# Patient Record
Sex: Female | Born: 1985
Health system: Southern US, Community
[De-identification: ages and names within clinical notes are randomized; demographics above are authoritative.]

## PROBLEM LIST (undated history)

## (undated) DIAGNOSIS — B009 Herpesviral infection, unspecified: Secondary | ICD-10-CM

## (undated) DIAGNOSIS — I10 Essential (primary) hypertension: Secondary | ICD-10-CM

## (undated) DIAGNOSIS — L102 Pemphigus foliaceous: Secondary | ICD-10-CM

## (undated) DIAGNOSIS — J45909 Unspecified asthma, uncomplicated: Secondary | ICD-10-CM

## (undated) HISTORY — PX: HERNIA REPAIR: SHX51

## (undated) HISTORY — PX: TONSILLECTOMY: SUR1361

---
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
## 2009-05-13 ENCOUNTER — Emergency Department (HOSPITAL_COMMUNITY): Admission: EM | Admit: 2009-05-13 | Discharge: 2009-05-13 | Payer: Self-pay | Admitting: Emergency Medicine

## 2010-10-26 ENCOUNTER — Emergency Department (HOSPITAL_COMMUNITY)
Admission: EM | Admit: 2010-10-26 | Discharge: 2010-10-26 | Payer: Self-pay | Source: Home / Self Care | Admitting: Emergency Medicine

## 2011-09-10 DIAGNOSIS — L102 Pemphigus foliaceous: Secondary | ICD-10-CM | POA: Insufficient documentation

## 2011-09-10 DIAGNOSIS — L109 Pemphigus, unspecified: Secondary | ICD-10-CM | POA: Insufficient documentation

## 2014-03-30 ENCOUNTER — Emergency Department (HOSPITAL_COMMUNITY): Payer: No Typology Code available for payment source

## 2014-03-30 ENCOUNTER — Emergency Department (HOSPITAL_COMMUNITY)
Admission: EM | Admit: 2014-03-30 | Discharge: 2014-03-30 | Disposition: A | Discharge status: Home / Self Care | Payer: No Typology Code available for payment source | Attending: Emergency Medicine | Admitting: Emergency Medicine

## 2014-03-30 ENCOUNTER — Encounter (HOSPITAL_COMMUNITY): Payer: Self-pay | Admitting: Emergency Medicine

## 2014-03-30 DIAGNOSIS — Y9389 Activity, other specified: Secondary | ICD-10-CM | POA: Insufficient documentation

## 2014-03-30 DIAGNOSIS — Y92410 Unspecified street and highway as the place of occurrence of the external cause: Secondary | ICD-10-CM | POA: Insufficient documentation

## 2014-03-30 DIAGNOSIS — Z872 Personal history of diseases of the skin and subcutaneous tissue: Secondary | ICD-10-CM | POA: Insufficient documentation

## 2014-03-30 DIAGNOSIS — IMO0002 Reserved for concepts with insufficient information to code with codable children: Secondary | ICD-10-CM | POA: Insufficient documentation

## 2014-03-30 DIAGNOSIS — T2121XA Burn of second degree of chest wall, initial encounter: Secondary | ICD-10-CM | POA: Insufficient documentation

## 2014-03-30 DIAGNOSIS — Z79899 Other long term (current) drug therapy: Secondary | ICD-10-CM | POA: Insufficient documentation

## 2014-03-30 DIAGNOSIS — J45909 Unspecified asthma, uncomplicated: Secondary | ICD-10-CM | POA: Insufficient documentation

## 2014-03-30 HISTORY — DX: Pemphigus foliaceous: L10.2

## 2014-03-30 HISTORY — DX: Unspecified asthma, uncomplicated: J45.909

## 2014-03-30 MED ORDER — OXYCODONE-ACETAMINOPHEN 5-325 MG PO TABS
1.0000 | ORAL_TABLET | Freq: Once | ORAL | Status: AC
  Administered 2014-03-30: 1 via ORAL
  Filled 2014-03-30: qty 1

## 2014-03-30 MED ORDER — IBUPROFEN 200 MG PO TABS
600.0000 mg | ORAL_TABLET | Freq: Once | ORAL | Status: AC
  Administered 2014-03-30: 600 mg via ORAL
  Filled 2014-03-30: qty 3

## 2014-03-30 MED ORDER — HYDROCODONE-ACETAMINOPHEN 5-325 MG PO TABS: 1.0000 | ORAL_TABLET | ORAL | Status: DC | PRN

## 2014-03-30 MED ORDER — IBUPROFEN 600 MG PO TABS: 600.0000 mg | ORAL_TABLET | Freq: Three times a day (TID) | ORAL | Status: DC | PRN

## 2014-03-30 NOTE — ED Notes (Signed)
Assumed care of patient Patient alert and oriented x 4 Patient ambulatory to bathroom without difficulty or nursing need of assistance--gait steady

## 2014-03-30 NOTE — ED Notes (Signed)
Patient transported to X-ray 
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
## 2014-03-30 NOTE — ED Notes (Signed)
MD at bedside. 

## 2014-03-30 NOTE — Discharge Instructions (Signed)
Burn Care Your skin is a natural barrier to infection. It is the largest organ of your body. Burns damage this natural protection. To help prevent infection, it is very important to follow your caregiver's instructions in the care of your burn. Burns are classified as:  First degree. There is only redness of the skin (erythema). No scarring is expected.  Second degree. There is blistering of the skin. Scarring may occur with deeper burns.  Third degree. All layers of the skin are injured, and scarring is expected. HOME CARE INSTRUCTIONS   Wash your hands well before changing your bandage.  Change your bandage as often as directed by your caregiver.  Remove the old bandage. If the bandage sticks, you may soak it off with cool, clean water.  Cleanse the burn thoroughly but gently with mild soap and water.  Pat the area dry with a clean, dry cloth.  Apply a thin layer of antibacterial cream to the burn.  Apply a clean bandage as instructed by your caregiver.  Keep the bandage as clean and dry as possible.  Elevate the affected area for the first 24 hours, then as instructed by your caregiver.  Only take over-the-counter or prescription medicines for pain, discomfort, or fever as directed by your caregiver. SEEK IMMEDIATE MEDICAL CARE IF:   You develop excessive pain.  You develop redness, tenderness, swelling, or red streaks near the burn.  The burned area develops yellowish-white fluid (pus) or a bad smell.  You have a fever. MAKE SURE YOU:   Understand these instructions.  Will watch your condition.  Will get help right away if you are not doing well or get worse. Document Released: 09/20/2005 Document Revised: 12/13/2011 Document Reviewed: 02/10/2011 ExitCare Patient Information 2015 [employer]. This information is not intended to replace advice given to you by your health care provider. Make sure you discuss any questions you have with your health care  provider.  
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
## 2015-07-09 DIAGNOSIS — O163 Unspecified maternal hypertension, third trimester: Secondary | ICD-10-CM | POA: Insufficient documentation

## 2015-07-21 DIAGNOSIS — O09619 Supervision of young primigravida, unspecified trimester: Secondary | ICD-10-CM | POA: Insufficient documentation

## 2015-10-31 DIAGNOSIS — H5213 Myopia, bilateral: Secondary | ICD-10-CM | POA: Diagnosis not present

## 2016-01-22 DIAGNOSIS — Z79899 Other long term (current) drug therapy: Secondary | ICD-10-CM | POA: Diagnosis not present

## 2016-01-22 DIAGNOSIS — Z7952 Long term (current) use of systemic steroids: Secondary | ICD-10-CM | POA: Diagnosis not present

## 2016-01-22 DIAGNOSIS — L700 Acne vulgaris: Secondary | ICD-10-CM | POA: Diagnosis not present

## 2016-01-22 DIAGNOSIS — L102 Pemphigus foliaceous: Secondary | ICD-10-CM | POA: Diagnosis not present

## 2016-02-03 MED FILL — NIFEDIPINE ER 30 MG TABLET: 30 | 30 days supply | Qty: 60 | Fill #0

## 2016-02-03 MED FILL — LABETALOL HCL 200 MG TABLET: 200 | 30 days supply | Qty: 60 | Fill #0

## 2016-02-10 MED FILL — azaTHIOprine 50 MG TABS: 50 | 90 days supply | Qty: 180 | Fill #0

## 2016-03-05 DIAGNOSIS — Z79899 Other long term (current) drug therapy: Secondary | ICD-10-CM | POA: Diagnosis not present

## 2016-03-25 MED FILL — LABETALOL HCL 200 MG TABLET: 200 | 30 days supply | Qty: 60 | Fill #1

## 2016-03-25 MED FILL — NIFEDIPINE ER 30 MG TABLET: 30 | 30 days supply | Qty: 60 | Fill #1

## 2016-04-13 MED FILL — azaTHIOprine 50 MG TABS: 50 | 30 days supply | Qty: 90 | Fill #0

## 2016-04-22 DIAGNOSIS — L102 Pemphigus foliaceous: Secondary | ICD-10-CM | POA: Diagnosis not present

## 2016-05-06 MED FILL — azaTHIOprine 50 MG TABS: 50 | 30 days supply | Qty: 90 | Fill #1

## 2016-05-19 MED FILL — HEATHER TABLET: 0.35 | 28 days supply | Qty: 28 | Fill #0

## 2016-05-27 MED FILL — NIFEDIPINE ER 30 MG TABLET: 30 | 30 days supply | Qty: 60 | Fill #2

## 2016-05-27 MED FILL — azaTHIOprine 50 MG TABS: 50 | 30 days supply | Qty: 90 | Fill #2

## 2016-05-27 MED FILL — LABETALOL HCL 200 MG TABLET: 200 | 30 days supply | Qty: 60 | Fill #2

## 2016-06-14 MED FILL — HEATHER TABLET: 0.35 | 28 days supply | Qty: 28 | Fill #1

## 2016-06-23 MED FILL — azaTHIOprine 50 MG TABS: 50 | 30 days supply | Qty: 90 | Fill #3

## 2016-07-14 MED FILL — azaTHIOprine 50 MG TABS: 50 | 22 days supply | Qty: 90 | Fill #0

## 2016-07-16 MED FILL — HEATHER TABLET: 0.35 | 28 days supply | Qty: 28 | Fill #2

## 2016-07-20 MED FILL — NIFEDIPINE ER 30 MG TABLET: 30 | 30 days supply | Qty: 60 | Fill #3

## 2016-07-20 MED FILL — LABETALOL HCL 200 MG TABLET: 200 | 30 days supply | Qty: 60 | Fill #3

## 2016-07-21 DIAGNOSIS — Z79899 Other long term (current) drug therapy: Secondary | ICD-10-CM | POA: Diagnosis not present

## 2016-07-21 DIAGNOSIS — L102 Pemphigus foliaceous: Secondary | ICD-10-CM | POA: Diagnosis not present

## 2016-07-21 MED FILL — predniSONE 5 MG TABS: 5 | 42 days supply | Qty: 42 | Fill #0

## 2016-07-23 DIAGNOSIS — Z79899 Other long term (current) drug therapy: Secondary | ICD-10-CM | POA: Diagnosis not present

## 2016-08-13 MED FILL — azaTHIOprine 50 MG TABS: 50 | 30 days supply | Qty: 120 | Fill #0

## 2016-08-18 DIAGNOSIS — Z01419 Encounter for gynecological examination (general) (routine) without abnormal findings: Secondary | ICD-10-CM | POA: Diagnosis not present

## 2016-08-18 DIAGNOSIS — N898 Other specified noninflammatory disorders of vagina: Secondary | ICD-10-CM | POA: Diagnosis not present

## 2016-08-18 DIAGNOSIS — Z1151 Encounter for screening for human papillomavirus (HPV): Secondary | ICD-10-CM | POA: Diagnosis not present

## 2016-08-18 DIAGNOSIS — Z6826 Body mass index (BMI) 26.0-26.9, adult: Secondary | ICD-10-CM | POA: Diagnosis not present

## 2016-08-18 MED FILL — metroNIDAZOLE 500 MG TABS: 500 | 7 days supply | Qty: 14 | Fill #0

## 2016-08-18 MED FILL — HYDROCHLOROTHIAZIDE 25 MG T: 25 | 90 days supply | Qty: 90 | Fill #0

## 2016-08-31 MED FILL — FLUCONAZOLE 150 MG TABLET: 150 | 1 days supply | Qty: 1 | Fill #0

## 2016-09-20 MED FILL — azaTHIOprine 50 MG TABS: 50 | 30 days supply | Qty: 120 | Fill #1

## 2016-10-01 MED FILL — predniSONE 5 MG TABS: 5 | 42 days supply | Qty: 40 | Fill #0

## 2016-10-06 MED FILL — metroNIDAZOLE 500 MG TABS: 500 | 7 days supply | Qty: 14 | Fill #0

## 2016-10-06 MED FILL — FLUCONAZOLE 150 MG TABLET: 150 | 1 days supply | Qty: 1 | Fill #0

## 2016-10-18 ENCOUNTER — Other Ambulatory Visit: Payer: Self-pay | Admitting: Obstetrics

## 2016-10-18 DIAGNOSIS — I10 Essential (primary) hypertension: Secondary | ICD-10-CM

## 2016-10-18 MED ORDER — AMLODIPINE BESYLATE 5 MG PO TABS: 5.0000 mg | ORAL_TABLET | Freq: Every day | ORAL | 11 refills | Status: DC

## 2016-10-18 MED ORDER — CARVEDILOL 12.5 MG PO TABS: 12.5000 mg | ORAL_TABLET | Freq: Two times a day (BID) | ORAL | 11 refills | Status: DC

## 2016-10-18 MED FILL — AMLODIPINE BESYLATE 5 MG TA: 5 | 30 days supply | Qty: 30 | Fill #0

## 2016-10-18 MED FILL — CARVEDILOL 12.5 MG TABLET: 12.5 | 30 days supply | Qty: 60 | Fill #0

## 2016-10-19 DIAGNOSIS — L102 Pemphigus foliaceous: Secondary | ICD-10-CM | POA: Diagnosis not present

## 2016-10-19 DIAGNOSIS — Z79899 Other long term (current) drug therapy: Secondary | ICD-10-CM | POA: Diagnosis not present

## 2016-10-19 MED FILL — CLOBETASOL 0.05% SOLUTION: 0.05 | 30 days supply | Qty: 50 | Fill #0

## 2016-10-21 DIAGNOSIS — Z79899 Other long term (current) drug therapy: Secondary | ICD-10-CM | POA: Diagnosis not present

## 2016-10-25 MED FILL — azaTHIOprine 50 MG TABS: 50 | 30 days supply | Qty: 120 | Fill #2

## 2016-11-04 MED FILL — HYDROCHLOROTHIAZIDE 25 MG T: 25 | 90 days supply | Qty: 90 | Fill #1

## 2016-11-15 DIAGNOSIS — L109 Pemphigus, unspecified: Secondary | ICD-10-CM | POA: Diagnosis not present

## 2016-11-15 DIAGNOSIS — Z79899 Other long term (current) drug therapy: Secondary | ICD-10-CM | POA: Diagnosis not present

## 2016-11-16 MED FILL — AMLODIPINE BESYLATE 5 MG TA: 5 | 30 days supply | Qty: 30 | Fill #1

## 2016-11-25 MED FILL — CARVEDILOL 12.5 MG TABLET: 12.5 | 30 days supply | Qty: 60 | Fill #1

## 2016-11-30 DIAGNOSIS — L109 Pemphigus, unspecified: Secondary | ICD-10-CM | POA: Diagnosis not present

## 2016-11-30 DIAGNOSIS — Z79899 Other long term (current) drug therapy: Secondary | ICD-10-CM | POA: Diagnosis not present

## 2016-11-30 MED FILL — azaTHIOprine 50 MG TABS: 50 | 30 days supply | Qty: 120 | Fill #0

## 2016-12-02 MED FILL — TAYTULLA 1 MG-20 MCG CAP: 1-20 | 28 days supply | Qty: 28 | Fill #0

## 2016-12-15 MED FILL — AMLODIPINE BESYLATE 5 MG TA: 5 | 30 days supply | Qty: 30 | Fill #2

## 2016-12-30 MED FILL — azaTHIOprine 50 MG TABS: 50 | 30 days supply | Qty: 120 | Fill #1

## 2016-12-31 MED FILL — LARIN 24 FE 1 MG-20 MCG TAB: 1-20 | 28 days supply | Qty: 28 | Fill #0

## 2017-01-11 MED FILL — CARVEDILOL 12.5 MG TABLET: 12.5 | 30 days supply | Qty: 60 | Fill #2

## 2017-01-18 DIAGNOSIS — L102 Pemphigus foliaceous: Secondary | ICD-10-CM | POA: Diagnosis not present

## 2017-01-18 DIAGNOSIS — M2550 Pain in unspecified joint: Secondary | ICD-10-CM | POA: Diagnosis not present

## 2017-01-18 MED FILL — predniSONE 5 MG TABS: 5 | 30 days supply | Qty: 60 | Fill #0

## 2017-01-21 MED FILL — AMLODIPINE BESYLATE 5 MG TA: 5 | 30 days supply | Qty: 30 | Fill #3

## 2017-01-31 MED FILL — azaTHIOprine 50 MG TABS: 50 | 30 days supply | Qty: 120 | Fill #2

## 2017-01-31 MED FILL — LARIN 24 FE 1 MG-20 MCG TAB: 1-20 | 84 days supply | Qty: 84 | Fill #1

## 2017-02-14 ENCOUNTER — Other Ambulatory Visit: Payer: Self-pay

## 2017-02-14 DIAGNOSIS — N898 Other specified noninflammatory disorders of vagina: Secondary | ICD-10-CM

## 2017-02-14 MED ORDER — FLUCONAZOLE 150 MG PO TABS: 150.0000 mg | ORAL_TABLET | Freq: Once | ORAL | 0 refills | Status: DC

## 2017-02-14 MED FILL — FLUCONAZOLE 150 MG TABLET: 150 | 1 days supply | Qty: 1 | Fill #0

## 2017-02-21 MED FILL — AMLODIPINE BESYLATE 5 MG TA: 5 | 30 days supply | Qty: 30 | Fill #4

## 2017-02-23 MED FILL — HYDROCHLOROTHIAZIDE 25 MG T: 25 | 90 days supply | Qty: 90 | Fill #2

## 2017-03-07 MED FILL — azaTHIOprine 50 MG TABS: 50 | 30 days supply | Qty: 120 | Fill #0

## 2017-03-14 MED FILL — CARVEDILOL 12.5 MG TABLET: 12.5 | 90 days supply | Qty: 180 | Fill #3

## 2017-03-23 ENCOUNTER — Other Ambulatory Visit: Payer: Self-pay

## 2017-03-23 MED ORDER — METRONIDAZOLE 500 MG PO TABS: 500.0000 mg | ORAL_TABLET | Freq: Two times a day (BID) | ORAL | 0 refills | Status: DC

## 2017-03-23 MED ORDER — FLUCONAZOLE 150 MG PO TABS: 150.0000 mg | ORAL_TABLET | Freq: Once | ORAL | 0 refills | Status: DC

## 2017-03-23 MED FILL — metroNIDAZOLE 500 MG TABS: 500 | 7 days supply | Qty: 14 | Fill #0

## 2017-03-23 MED FILL — FLUCONAZOLE 150 MG TABLET: 150 | 1 days supply | Qty: 1 | Fill #0

## 2017-03-25 ENCOUNTER — Other Ambulatory Visit: Payer: Self-pay

## 2017-03-25 MED ORDER — LORATADINE 10 MG PO TABS: 10.0000 mg | ORAL_TABLET | Freq: Every day | ORAL | 11 refills | Status: DC

## 2017-03-25 MED FILL — CLEAR-ATADINE 10 MG TABLET: 10 | 90 days supply | Qty: 90 | Fill #0

## 2017-04-04 MED FILL — AMLODIPINE BESYLATE 5 MG TA: 5 | 30 days supply | Qty: 30 | Fill #5

## 2017-04-07 MED FILL — azaTHIOprine 50 MG TABS: 50 | 30 days supply | Qty: 120 | Fill #1

## 2017-04-22 MED FILL — LARIN 24 FE 1 MG-20 MCG TAB: 1-20 | 84 days supply | Qty: 84 | Fill #2

## 2017-05-03 DIAGNOSIS — L102 Pemphigus foliaceous: Secondary | ICD-10-CM | POA: Diagnosis not present

## 2017-05-03 DIAGNOSIS — Z79899 Other long term (current) drug therapy: Secondary | ICD-10-CM | POA: Diagnosis not present

## 2017-05-09 MED FILL — azaTHIOprine 50 MG TABS: 50 | 30 days supply | Qty: 120 | Fill #2

## 2017-05-19 DIAGNOSIS — N941 Unspecified dyspareunia: Secondary | ICD-10-CM | POA: Diagnosis not present

## 2017-05-19 DIAGNOSIS — Z113 Encounter for screening for infections with a predominantly sexual mode of transmission: Secondary | ICD-10-CM | POA: Diagnosis not present

## 2017-05-19 DIAGNOSIS — Z118 Encounter for screening for other infectious and parasitic diseases: Secondary | ICD-10-CM | POA: Diagnosis not present

## 2017-05-20 MED FILL — AMLODIPINE BESYLATE 5 MG TA: 5 | 90 days supply | Qty: 90 | Fill #6

## 2017-05-24 DIAGNOSIS — L109 Pemphigus, unspecified: Secondary | ICD-10-CM | POA: Diagnosis not present

## 2017-05-31 DIAGNOSIS — R1032 Left lower quadrant pain: Secondary | ICD-10-CM | POA: Diagnosis not present

## 2017-06-07 DIAGNOSIS — L109 Pemphigus, unspecified: Secondary | ICD-10-CM | POA: Diagnosis not present

## 2017-06-08 MED FILL — HYDROCHLOROTHIAZIDE 25 MG T: 25 | 90 days supply | Qty: 90 | Fill #3

## 2017-06-08 MED FILL — azaTHIOprine 50 MG TABS: 50 | 30 days supply | Qty: 120 | Fill #0

## 2017-07-14 MED FILL — LARIN 24 FE 1 MG-20 MCG TAB: 1-20 | 56 days supply | Qty: 56 | Fill #3

## 2017-07-18 MED FILL — azaTHIOprine 50 MG TABS: 50 | 30 days supply | Qty: 120 | Fill #1

## 2017-07-21 ENCOUNTER — Other Ambulatory Visit: Payer: Self-pay

## 2017-07-22 MED ORDER — METRONIDAZOLE 500 MG PO TABS: 500.0000 mg | ORAL_TABLET | Freq: Two times a day (BID) | ORAL | 0 refills | Status: AC

## 2017-07-22 MED ORDER — FLUCONAZOLE 150 MG PO TABS: 150.0000 mg | ORAL_TABLET | Freq: Once | ORAL | 0 refills | Status: DC

## 2017-07-22 MED FILL — metroNIDAZOLE 500 MG TABS: 500 | 7 days supply | Qty: 14 | Fill #0

## 2017-07-22 MED FILL — FLUCONAZOLE 150 MG TABLET: 150 | 1 days supply | Qty: 1 | Fill #0

## 2017-08-08 MED FILL — CARVEDILOL 12.5 MG TABLET: 12.5 | 90 days supply | Qty: 180 | Fill #4

## 2017-08-08 MED FILL — AMLODIPINE BESYLATE 5 MG TA: 5 | 90 days supply | Qty: 90 | Fill #7

## 2017-08-19 DIAGNOSIS — Z6827 Body mass index (BMI) 27.0-27.9, adult: Secondary | ICD-10-CM | POA: Diagnosis not present

## 2017-08-19 DIAGNOSIS — I10 Essential (primary) hypertension: Secondary | ICD-10-CM | POA: Diagnosis not present

## 2017-08-19 DIAGNOSIS — H52223 Regular astigmatism, bilateral: Secondary | ICD-10-CM | POA: Diagnosis not present

## 2017-08-19 DIAGNOSIS — Z01419 Encounter for gynecological examination (general) (routine) without abnormal findings: Secondary | ICD-10-CM | POA: Diagnosis not present

## 2017-08-22 MED FILL — azaTHIOprine 50 MG TABS: 50 | 30 days supply | Qty: 120 | Fill #2

## 2017-08-24 MED FILL — HYDROCHLOROTHIAZIDE 25 MG T: 25 | 30 days supply | Qty: 30 | Fill #0

## 2017-09-13 MED FILL — BLISOVI 24 FE TABLET: 1-20 | 28 days supply | Qty: 28 | Fill #0

## 2017-09-16 DIAGNOSIS — Z79899 Other long term (current) drug therapy: Secondary | ICD-10-CM | POA: Diagnosis not present

## 2017-09-16 DIAGNOSIS — L102 Pemphigus foliaceous: Secondary | ICD-10-CM | POA: Diagnosis not present

## 2017-09-16 MED FILL — azaTHIOprine 50 MG TABS: 50 | 30 days supply | Qty: 90 | Fill #0

## 2017-09-16 MED FILL — SALEX 6% SHAMPOO: 6 | 30 days supply | Qty: 177 | Fill #0

## 2017-09-16 MED FILL — CLOBETASOL 0.05% OINTMENT: 0.05 | 14 days supply | Qty: 60 | Fill #0

## 2017-09-20 MED FILL — FLUOCINOLONE 0.01% SCALP OI: 0.01 | 30 days supply | Qty: 118 | Fill #0

## 2017-09-23 ENCOUNTER — Other Ambulatory Visit: Payer: Self-pay

## 2017-09-23 DIAGNOSIS — Z79899 Other long term (current) drug therapy: Secondary | ICD-10-CM | POA: Diagnosis not present

## 2017-09-23 MED ORDER — FLUCONAZOLE 150 MG PO TABS: 150.0000 mg | ORAL_TABLET | Freq: Once | ORAL | 0 refills | Status: DC

## 2017-09-23 MED FILL — FLUCONAZOLE 150 MG TABLET: 150 | 1 days supply | Qty: 1 | Fill #0

## 2017-10-07 MED FILL — BLISOVI 24 FE TABLET: 1-20 | 84 days supply | Qty: 84 | Fill #1

## 2017-10-28 MED FILL — HYDROCHLOROTHIAZIDE 25 MG T: 25 | 30 days supply | Qty: 30 | Fill #1

## 2017-11-08 MED FILL — azaTHIOprine 50 MG TABS: 50 | 30 days supply | Qty: 90 | Fill #1

## 2017-11-09 MED FILL — metroNIDAZOLE 500 MG TABS: 500 | 7 days supply | Qty: 14 | Fill #0

## 2017-11-14 MED FILL — FLUCONAZOLE 150 MG TABLET: 150 | 3 days supply | Qty: 3 | Fill #0

## 2017-12-12 ENCOUNTER — Other Ambulatory Visit: Payer: Self-pay

## 2017-12-12 DIAGNOSIS — I10 Essential (primary) hypertension: Secondary | ICD-10-CM

## 2017-12-12 MED ORDER — CARVEDILOL 12.5 MG PO TABS: 12.5000 mg | ORAL_TABLET | Freq: Two times a day (BID) | ORAL | 11 refills | Status: DC

## 2017-12-12 MED ORDER — AMLODIPINE BESYLATE 5 MG PO TABS: 5.0000 mg | ORAL_TABLET | Freq: Every day | ORAL | 11 refills | Status: DC

## 2017-12-12 MED ORDER — TRIAMTERENE-HCTZ 37.5-25 MG PO CAPS: 1.0000 | ORAL_CAPSULE | Freq: Every day | ORAL | 11 refills | Status: DC

## 2017-12-12 MED FILL — AMLODIPINE BESYLATE 5 MG TA: 5 | 90 days supply | Qty: 90 | Fill #0

## 2017-12-14 ENCOUNTER — Other Ambulatory Visit: Payer: Self-pay

## 2017-12-15 MED ORDER — LISINOPRIL-HYDROCHLOROTHIAZIDE 20-25 MG PO TABS: 1.0000 | ORAL_TABLET | Freq: Every day | ORAL | 11 refills | Status: DC

## 2017-12-15 MED FILL — LISINOPRIL-HCTZ 20-25 MG TA: 20-25 | 90 days supply | Qty: 90 | Fill #0

## 2017-12-26 DIAGNOSIS — L109 Pemphigus, unspecified: Secondary | ICD-10-CM | POA: Diagnosis not present

## 2017-12-26 DIAGNOSIS — Z79899 Other long term (current) drug therapy: Secondary | ICD-10-CM | POA: Diagnosis not present

## 2017-12-29 MED FILL — azaTHIOprine 50 MG TABS: 50 | 30 days supply | Qty: 90 | Fill #2

## 2017-12-30 MED FILL — BLISOVI 24 FE TABLET: 1-20 | 84 days supply | Qty: 84 | Fill #2

## 2018-01-03 ENCOUNTER — Other Ambulatory Visit: Payer: Self-pay | Admitting: Obstetrics

## 2018-01-03 DIAGNOSIS — J020 Streptococcal pharyngitis: Secondary | ICD-10-CM

## 2018-01-03 MED ORDER — AMOXICILLIN-POT CLAVULANATE 875-125 MG PO TABS: 1.0000 | ORAL_TABLET | Freq: Two times a day (BID) | ORAL | 1 refills | Status: DC

## 2018-01-03 MED FILL — AMOX-CLAV 875-125 MG TABLET: 875-125 | 7 days supply | Qty: 14 | Fill #0

## 2018-01-09 DIAGNOSIS — Z5112 Encounter for antineoplastic immunotherapy: Secondary | ICD-10-CM | POA: Diagnosis not present

## 2018-01-09 DIAGNOSIS — L109 Pemphigus, unspecified: Secondary | ICD-10-CM | POA: Diagnosis not present

## 2018-01-24 MED FILL — AMOXICILLIN 500 MG CAPSULE: 500 | 7 days supply | Qty: 21 | Fill #0

## 2018-01-27 DIAGNOSIS — L102 Pemphigus foliaceous: Secondary | ICD-10-CM | POA: Diagnosis not present

## 2018-03-09 MED FILL — AMLODIPINE BESYLATE 5 MG TA: 5 | 90 days supply | Qty: 90 | Fill #1

## 2018-03-09 MED FILL — LISINOPRIL-HCTZ 20-25 MG TA: 20-25 | 90 days supply | Qty: 90 | Fill #1

## 2018-03-24 MED FILL — BLISOVI 24 FE TABLET: 1-20 | 84 days supply | Qty: 84 | Fill #3

## 2018-04-05 MED FILL — azaTHIOprine 50 MG TABS: 50 | 30 days supply | Qty: 90 | Fill #3

## 2018-05-10 ENCOUNTER — Other Ambulatory Visit: Payer: Self-pay | Admitting: Obstetrics

## 2018-05-10 DIAGNOSIS — B373 Candidiasis of vulva and vagina: Secondary | ICD-10-CM

## 2018-05-10 DIAGNOSIS — N3000 Acute cystitis without hematuria: Secondary | ICD-10-CM

## 2018-05-10 DIAGNOSIS — B3731 Acute candidiasis of vulva and vagina: Secondary | ICD-10-CM

## 2018-05-10 MED ORDER — NITROFURANTOIN MONOHYD MACRO 100 MG PO CAPS: 100.0000 mg | ORAL_CAPSULE | Freq: Two times a day (BID) | ORAL | 2 refills | Status: DC

## 2018-05-10 MED ORDER — FLUCONAZOLE 150 MG PO TABS: 150.0000 mg | ORAL_TABLET | Freq: Once | ORAL | 2 refills | Status: DC

## 2018-05-10 MED FILL — FLUCONAZOLE 150 MG TABS: 150 | 1 days supply | Qty: 1 | Fill #0

## 2018-05-10 MED FILL — NITROFURANTOIN MONO-MCR 100: 100 | 7 days supply | Qty: 14 | Fill #0

## 2018-05-19 DIAGNOSIS — L819 Disorder of pigmentation, unspecified: Secondary | ICD-10-CM | POA: Diagnosis not present

## 2018-05-19 DIAGNOSIS — Z79899 Other long term (current) drug therapy: Secondary | ICD-10-CM | POA: Diagnosis not present

## 2018-05-19 DIAGNOSIS — L102 Pemphigus foliaceous: Secondary | ICD-10-CM | POA: Diagnosis not present

## 2018-06-08 MED FILL — BLISOVI 24 FE TABLET: 1-20 | 56 days supply | Qty: 56 | Fill #4

## 2018-06-08 MED FILL — AMLODIPINE BESYLATE 5 MG TA: 5 | 90 days supply | Qty: 90 | Fill #2

## 2018-06-08 MED FILL — LISINOPRIL-HCTZ 20-25 MG TA: 20-25 | 90 days supply | Qty: 90 | Fill #2

## 2018-06-15 MED FILL — XULANE PATCH: 150-35 | 28 days supply | Qty: 3 | Fill #0

## 2018-06-21 DIAGNOSIS — Z118 Encounter for screening for other infectious and parasitic diseases: Secondary | ICD-10-CM | POA: Diagnosis not present

## 2018-06-21 DIAGNOSIS — N760 Acute vaginitis: Secondary | ICD-10-CM | POA: Diagnosis not present

## 2018-06-21 MED FILL — FLUCONAZOLE 150 MG TABS: 150 | 1 days supply | Qty: 1 | Fill #0

## 2018-06-21 MED FILL — metroNIDAZOLE 500 MG TABS: 500 | 7 days supply | Qty: 14 | Fill #0

## 2018-07-12 MED FILL — XULANE PATCH: 150-35 | 28 days supply | Qty: 3 | Fill #1

## 2018-07-13 DIAGNOSIS — L109 Pemphigus, unspecified: Secondary | ICD-10-CM | POA: Diagnosis not present

## 2018-07-28 DIAGNOSIS — L109 Pemphigus, unspecified: Secondary | ICD-10-CM | POA: Diagnosis not present

## 2018-07-31 ENCOUNTER — Other Ambulatory Visit: Payer: Self-pay | Admitting: Obstetrics

## 2018-07-31 DIAGNOSIS — B9689 Other specified bacterial agents as the cause of diseases classified elsewhere: Secondary | ICD-10-CM

## 2018-07-31 DIAGNOSIS — B373 Candidiasis of vulva and vagina: Secondary | ICD-10-CM

## 2018-07-31 DIAGNOSIS — N760 Acute vaginitis: Principal | ICD-10-CM

## 2018-07-31 DIAGNOSIS — B3731 Acute candidiasis of vulva and vagina: Secondary | ICD-10-CM

## 2018-07-31 MED ORDER — FLUCONAZOLE 150 MG PO TABS: 150.0000 mg | ORAL_TABLET | Freq: Once | ORAL | 2 refills | Status: DC

## 2018-07-31 MED ORDER — METRONIDAZOLE 500 MG PO TABS: 500.0000 mg | ORAL_TABLET | Freq: Two times a day (BID) | ORAL | 2 refills | Status: DC

## 2018-07-31 MED FILL — FLUCONAZOLE 150 MG TABS: 150 | 1 days supply | Qty: 1 | Fill #0

## 2018-07-31 MED FILL — metroNIDAZOLE 500 MG TABS: 500 | 7 days supply | Qty: 14 | Fill #0

## 2018-08-04 ENCOUNTER — Other Ambulatory Visit: Payer: Self-pay | Admitting: Obstetrics

## 2018-08-04 DIAGNOSIS — T753XXA Motion sickness, initial encounter: Secondary | ICD-10-CM

## 2018-08-04 MED ORDER — SCOPOLAMINE 1 MG/3DAYS TD PT72: 1.0000 | MEDICATED_PATCH | TRANSDERMAL | 0 refills | Status: DC

## 2018-08-04 MED FILL — SCOPOLAMINE 1 MG/3DAYS PT72: 1 | 12 days supply | Qty: 4 | Fill #0

## 2018-08-14 MED FILL — XULANE PATCH: 150-35 | 28 days supply | Qty: 3 | Fill #2

## 2018-08-22 MED FILL — CLOBETASOL 0.05% SOLUTION: 0.05 | 30 days supply | Qty: 50 | Fill #0

## 2018-08-23 MED FILL — FLUCONAZOLE 150 MG TABS: 150 | 1 days supply | Qty: 1 | Fill #1

## 2018-08-23 MED FILL — metroNIDAZOLE 500 MG TABS: 500 | 7 days supply | Qty: 14 | Fill #1

## 2018-08-25 DIAGNOSIS — Z113 Encounter for screening for infections with a predominantly sexual mode of transmission: Secondary | ICD-10-CM | POA: Diagnosis not present

## 2018-08-25 DIAGNOSIS — Z114 Encounter for screening for human immunodeficiency virus [HIV]: Secondary | ICD-10-CM | POA: Diagnosis not present

## 2018-08-25 DIAGNOSIS — Z01419 Encounter for gynecological examination (general) (routine) without abnormal findings: Secondary | ICD-10-CM | POA: Diagnosis not present

## 2018-08-25 DIAGNOSIS — Z118 Encounter for screening for other infectious and parasitic diseases: Secondary | ICD-10-CM | POA: Diagnosis not present

## 2018-08-25 DIAGNOSIS — Z6825 Body mass index (BMI) 25.0-25.9, adult: Secondary | ICD-10-CM | POA: Diagnosis not present

## 2018-08-25 DIAGNOSIS — Z1159 Encounter for screening for other viral diseases: Secondary | ICD-10-CM | POA: Diagnosis not present

## 2018-09-04 MED FILL — AMLODIPINE BESYLATE 5 MG TA: 5 | 90 days supply | Qty: 90 | Fill #3

## 2018-09-04 MED FILL — LISINOPRIL-HCTZ 20-25 MG TA: 20-25 | 90 days supply | Qty: 90 | Fill #3

## 2018-09-15 MED FILL — XULANE PATCH: 150-35 | 84 days supply | Qty: 9 | Fill #0

## 2018-10-30 ENCOUNTER — Other Ambulatory Visit: Payer: Self-pay | Admitting: Obstetrics

## 2018-10-30 DIAGNOSIS — J111 Influenza due to unidentified influenza virus with other respiratory manifestations: Secondary | ICD-10-CM

## 2018-10-30 MED ORDER — OSELTAMIVIR PHOSPHATE 75 MG PO CAPS: 75.0000 mg | ORAL_CAPSULE | Freq: Two times a day (BID) | ORAL | 0 refills | Status: DC

## 2018-10-30 MED FILL — OSELTAMIVIR PHOS 75 MG CAP: 75 | 5 days supply | Qty: 10 | Fill #0

## 2018-10-31 ENCOUNTER — Other Ambulatory Visit: Payer: Self-pay | Admitting: Obstetrics

## 2018-10-31 DIAGNOSIS — J020 Streptococcal pharyngitis: Secondary | ICD-10-CM

## 2018-10-31 MED ORDER — AMOXICILLIN-POT CLAVULANATE 875-125 MG PO TABS: 1.0000 | ORAL_TABLET | Freq: Two times a day (BID) | ORAL | 1 refills | Status: DC

## 2018-10-31 MED FILL — AMOX-CLAV 875-125 MG TABLET: 875-125 | 7 days supply | Qty: 14 | Fill #0

## 2018-11-20 ENCOUNTER — Other Ambulatory Visit: Payer: Self-pay | Admitting: Obstetrics

## 2018-11-20 DIAGNOSIS — J020 Streptococcal pharyngitis: Secondary | ICD-10-CM

## 2018-11-20 MED ORDER — AMOXICILLIN-POT CLAVULANATE 875-125 MG PO TABS: 1.0000 | ORAL_TABLET | Freq: Two times a day (BID) | ORAL | 1 refills | Status: DC

## 2018-11-20 MED FILL — AMOX-CLAV 875-125 MG TABLET: 875-125 | 7 days supply | Qty: 14 | Fill #0

## 2018-11-22 ENCOUNTER — Other Ambulatory Visit: Payer: Self-pay | Admitting: Obstetrics

## 2018-11-22 ENCOUNTER — Other Ambulatory Visit: Payer: Self-pay

## 2018-11-22 DIAGNOSIS — J069 Acute upper respiratory infection, unspecified: Secondary | ICD-10-CM

## 2018-11-22 MED ORDER — AZITHROMYCIN 250 MG PO TABS: ORAL_TABLET | ORAL | 1 refills | Status: DC

## 2018-11-22 MED ORDER — FLUCONAZOLE 150 MG PO TABS: 150.0000 mg | ORAL_TABLET | Freq: Once | ORAL | 0 refills | Status: DC

## 2018-11-22 MED FILL — AZITHROMYCIN 250 MG TABLET: 250 | 5 days supply | Qty: 6 | Fill #0

## 2018-11-22 MED FILL — FLUCONAZOLE 150 MG TABS: 150 | 1 days supply | Qty: 1 | Fill #0

## 2018-11-28 ENCOUNTER — Other Ambulatory Visit: Payer: Self-pay | Admitting: Obstetrics

## 2018-11-28 DIAGNOSIS — R058 Other specified cough: Secondary | ICD-10-CM

## 2018-11-28 DIAGNOSIS — R05 Cough: Secondary | ICD-10-CM

## 2018-11-28 MED ORDER — BENZONATATE 100 MG PO CAPS: 200.0000 mg | ORAL_CAPSULE | Freq: Three times a day (TID) | ORAL | 2 refills | Status: DC | PRN

## 2018-11-28 MED FILL — BENZONATATE 100 MG CAPS: 100 | 7 days supply | Qty: 42 | Fill #0

## 2018-12-06 ENCOUNTER — Other Ambulatory Visit: Payer: Self-pay | Admitting: Obstetrics

## 2018-12-06 DIAGNOSIS — I10 Essential (primary) hypertension: Secondary | ICD-10-CM

## 2018-12-06 MED ORDER — AMLODIPINE BESYLATE 5 MG PO TABS: 5.0000 mg | ORAL_TABLET | Freq: Every day | ORAL | 11 refills | Status: DC

## 2018-12-06 MED ORDER — LISINOPRIL-HYDROCHLOROTHIAZIDE 20-25 MG PO TABS: 1.0000 | ORAL_TABLET | Freq: Every day | ORAL | 11 refills | Status: DC

## 2018-12-06 MED FILL — LISINOPRIL-HCTZ 20-25 MG TA: 20-25 | 30 days supply | Qty: 30 | Fill #0 | Status: TO

## 2018-12-06 MED FILL — XULANE PATCH: 150-35 | 84 days supply | Qty: 9 | Fill #1 | Status: TO

## 2018-12-06 MED FILL — AMLODIPINE BESYLATE 5 MG TA: 5 | 30 days supply | Qty: 30 | Fill #0 | Status: TO

## 2019-01-10 MED FILL — AMLODIPINE BESYLATE 5 MG TA: 5 | 30 days supply | Qty: 30 | Fill #0

## 2019-01-10 MED FILL — LISINOPRIL-HCTZ 20-25 MG TA: 20-25 | 30 days supply | Qty: 30 | Fill #0

## 2019-01-17 ENCOUNTER — Other Ambulatory Visit: Payer: Self-pay

## 2019-01-17 DIAGNOSIS — N760 Acute vaginitis: Principal | ICD-10-CM

## 2019-01-17 DIAGNOSIS — B9689 Other specified bacterial agents as the cause of diseases classified elsewhere: Secondary | ICD-10-CM

## 2019-01-17 MED ORDER — FLUCONAZOLE 150 MG PO TABS: 150.0000 mg | ORAL_TABLET | Freq: Once | ORAL | 1 refills | Status: DC

## 2019-01-17 MED ORDER — METRONIDAZOLE 500 MG PO TABS: 500.0000 mg | ORAL_TABLET | Freq: Two times a day (BID) | ORAL | 0 refills | Status: DC

## 2019-01-17 MED FILL — FLUCONAZOLE 150 MG TABS: 150 | 1 days supply | Qty: 1 | Fill #0

## 2019-01-17 MED FILL — METRONIDAZOLE 500 MG TABS: 500 | 7 days supply | Qty: 14 | Fill #0

## 2019-01-25 ENCOUNTER — Other Ambulatory Visit: Payer: Self-pay | Admitting: Obstetrics

## 2019-01-25 DIAGNOSIS — N760 Acute vaginitis: Principal | ICD-10-CM

## 2019-01-25 DIAGNOSIS — B9689 Other specified bacterial agents as the cause of diseases classified elsewhere: Secondary | ICD-10-CM

## 2019-02-01 MED FILL — METRONIDAZOLE 500 MG TABS: 500 | 7 days supply | Qty: 14 | Fill #0

## 2019-02-02 ENCOUNTER — Other Ambulatory Visit: Payer: Self-pay | Admitting: Obstetrics

## 2019-02-02 DIAGNOSIS — B9689 Other specified bacterial agents as the cause of diseases classified elsewhere: Secondary | ICD-10-CM

## 2019-02-02 DIAGNOSIS — N760 Acute vaginitis: Principal | ICD-10-CM

## 2019-02-03 MED FILL — AMLODIPINE BESYLATE 5 MG TA: 5 | 30 days supply | Qty: 30 | Fill #1

## 2019-02-03 MED FILL — LISINOPRIL-HCTZ 20-25 MG TA: 20-25 | 30 days supply | Qty: 30 | Fill #1

## 2019-03-05 MED FILL — LISINOPRIL-HCTZ 20-25 MG TA: 20-25 | 30 days supply | Qty: 30 | Fill #2

## 2019-03-05 MED FILL — AMLODIPINE BESYLATE 5 MG TA: 5 | 30 days supply | Qty: 30 | Fill #2

## 2019-03-14 MED FILL — XULANE PATCH: 150-35 | 84 days supply | Qty: 9 | Fill #0

## 2019-04-06 MED FILL — LISINOPRIL-HCTZ 20-25 MG TA: 20-25 | 30 days supply | Qty: 30 | Fill #3

## 2019-04-06 MED FILL — AMLODIPINE BESYLATE 5 MG TA: 5 | 30 days supply | Qty: 30 | Fill #3

## 2019-04-17 ENCOUNTER — Other Ambulatory Visit: Payer: Self-pay

## 2019-04-17 DIAGNOSIS — R300 Dysuria: Secondary | ICD-10-CM | POA: Diagnosis not present

## 2019-04-18 ENCOUNTER — Other Ambulatory Visit: Payer: Self-pay

## 2019-04-18 DIAGNOSIS — Z113 Encounter for screening for infections with a predominantly sexual mode of transmission: Secondary | ICD-10-CM | POA: Diagnosis not present

## 2019-04-18 DIAGNOSIS — N898 Other specified noninflammatory disorders of vagina: Secondary | ICD-10-CM | POA: Diagnosis not present

## 2019-04-19 LAB — CERVICOVAGINAL ANCILLARY ONLY
Bacterial vaginitis: NEGATIVE
Candida vaginitis: NEGATIVE
Chlamydia: NEGATIVE
Neisseria Gonorrhea: NEGATIVE
Trichomonas: NEGATIVE

## 2019-04-19 LAB — URINE CULTURE: Organism ID, Bacteria: NO GROWTH

## 2019-04-30 MED FILL — FLUCONAZOLE 150 MG TABS: 150 | 1 days supply | Qty: 1 | Fill #0

## 2019-05-04 MED FILL — LISINOPRIL-HCTZ 20-25 MG TA: 20-25 | 30 days supply | Qty: 30 | Fill #4

## 2019-05-04 MED FILL — AMLODIPINE BESYLATE 5 MG TA: 5 | 30 days supply | Qty: 30 | Fill #4

## 2019-05-30 DIAGNOSIS — B373 Candidiasis of vulva and vagina: Secondary | ICD-10-CM | POA: Diagnosis not present

## 2019-05-30 DIAGNOSIS — Z118 Encounter for screening for other infectious and parasitic diseases: Secondary | ICD-10-CM | POA: Diagnosis not present

## 2019-05-30 DIAGNOSIS — N760 Acute vaginitis: Secondary | ICD-10-CM | POA: Diagnosis not present

## 2019-05-30 MED FILL — METRONIDAZOLE 500 MG TABS: 500 | 7 days supply | Qty: 14 | Fill #0

## 2019-05-30 MED FILL — FLUCONAZOLE 150 MG TABLET: 150 | 1 days supply | Qty: 1 | Fill #0

## 2019-06-01 MED FILL — XULANE PATCH: 150-35 | 84 days supply | Qty: 9 | Fill #1

## 2019-06-03 MED FILL — AMLODIPINE BESYLATE 5 MG TA: 5 | 30 days supply | Qty: 30 | Fill #5

## 2019-06-03 MED FILL — LISINOPRIL-HCTZ 20-25 MG TA: 20-25 | 30 days supply | Qty: 30 | Fill #5

## 2019-06-29 DIAGNOSIS — L819 Disorder of pigmentation, unspecified: Secondary | ICD-10-CM | POA: Diagnosis not present

## 2019-06-29 DIAGNOSIS — L102 Pemphigus foliaceous: Secondary | ICD-10-CM | POA: Diagnosis not present

## 2019-06-29 MED FILL — CLOBETASOL 0.05% SOLUTION: 0.05 | 20 days supply | Qty: 50 | Fill #0

## 2019-07-04 MED FILL — AMLODIPINE BESYLATE 5 MG TA: 5 | 30 days supply | Qty: 30 | Fill #6

## 2019-07-04 MED FILL — LISINOPRIL-HCTZ 20-25 MG TA: 20-25 | 30 days supply | Qty: 30 | Fill #6

## 2019-07-17 ENCOUNTER — Other Ambulatory Visit: Payer: Self-pay | Admitting: Obstetrics

## 2019-07-17 DIAGNOSIS — B3731 Acute candidiasis of vulva and vagina: Secondary | ICD-10-CM

## 2019-07-17 DIAGNOSIS — N760 Acute vaginitis: Secondary | ICD-10-CM

## 2019-07-17 DIAGNOSIS — B9689 Other specified bacterial agents as the cause of diseases classified elsewhere: Secondary | ICD-10-CM

## 2019-07-17 DIAGNOSIS — B373 Candidiasis of vulva and vagina: Secondary | ICD-10-CM

## 2019-07-17 MED ORDER — FLUCONAZOLE 150 MG PO TABS: 150.0000 mg | ORAL_TABLET | Freq: Once | ORAL | 2 refills | Status: DC

## 2019-07-17 MED ORDER — TINIDAZOLE 500 MG PO TABS: 1000.0000 mg | ORAL_TABLET | Freq: Every day | ORAL | 2 refills | Status: DC

## 2019-07-17 MED FILL — TINIDAZOLE 500 MG TAB: 500 | 5 days supply | Qty: 10 | Fill #0

## 2019-07-17 MED FILL — FLUCONAZOLE 150 MG TABS: 150 | 1 days supply | Qty: 1 | Fill #0

## 2019-07-25 MED FILL — FLUCONAZOLE 150 MG TABS: 150 | 1 days supply | Qty: 1 | Fill #1

## 2019-08-02 MED FILL — LISINOPRIL-HCTZ 20-25 MG TA: 20-25 | 30 days supply | Qty: 30 | Fill #7

## 2019-08-02 MED FILL — AMLODIPINE BESYLATE 5 MG TA: 5 | 30 days supply | Qty: 30 | Fill #7

## 2019-08-29 MED FILL — XULANE PATCH: 150-35 | 28 days supply | Qty: 3 | Fill #0

## 2019-09-01 MED FILL — AMLODIPINE BESYLATE 5 MG TA: 5 | 30 days supply | Qty: 30 | Fill #8

## 2019-09-01 MED FILL — LISINOPRIL-HCTZ 20-25 MG TA: 20-25 | 30 days supply | Qty: 30 | Fill #8

## 2019-09-10 MED FILL — KETOCONAZOLE 2% CREAM: 2 | 30 days supply | Qty: 30 | Fill #0

## 2019-09-13 ENCOUNTER — Other Ambulatory Visit: Payer: Self-pay | Admitting: Obstetrics

## 2019-09-13 DIAGNOSIS — B3731 Acute candidiasis of vulva and vagina: Secondary | ICD-10-CM

## 2019-09-13 DIAGNOSIS — B373 Candidiasis of vulva and vagina: Secondary | ICD-10-CM

## 2019-09-13 MED ORDER — FLUCONAZOLE 150 MG PO TABS: 150.0000 mg | ORAL_TABLET | Freq: Once | ORAL | 2 refills | Status: DC

## 2019-09-13 MED FILL — FLUCONAZOLE 150 MG TABS: 150 | 1 days supply | Qty: 1 | Fill #0

## 2019-10-01 MED FILL — AMLODIPINE BESYLATE 5 MG TA: 5 | 30 days supply | Qty: 30 | Fill #9

## 2019-10-01 MED FILL — LISINOPRIL-HCTZ 20-25 MG TA: 20-25 | 30 days supply | Qty: 30 | Fill #9

## 2019-10-03 DIAGNOSIS — Z113 Encounter for screening for infections with a predominantly sexual mode of transmission: Secondary | ICD-10-CM | POA: Diagnosis not present

## 2019-10-03 DIAGNOSIS — Z1151 Encounter for screening for human papillomavirus (HPV): Secondary | ICD-10-CM | POA: Diagnosis not present

## 2019-10-03 DIAGNOSIS — Z01419 Encounter for gynecological examination (general) (routine) without abnormal findings: Secondary | ICD-10-CM | POA: Diagnosis not present

## 2019-10-03 DIAGNOSIS — Z6826 Body mass index (BMI) 26.0-26.9, adult: Secondary | ICD-10-CM | POA: Diagnosis not present

## 2019-10-03 DIAGNOSIS — Z114 Encounter for screening for human immunodeficiency virus [HIV]: Secondary | ICD-10-CM | POA: Diagnosis not present

## 2019-10-03 DIAGNOSIS — Z1159 Encounter for screening for other viral diseases: Secondary | ICD-10-CM | POA: Diagnosis not present

## 2019-10-03 MED FILL — XULANE PATCH: 150-35 | 28 days supply | Qty: 3 | Fill #0

## 2019-10-03 MED FILL — valACYclovir HCL 1 GM TABS: 1 | 20 days supply | Qty: 20 | Fill #0

## 2019-10-15 MED FILL — AZITHROMYCIN 250 MG TABS: 250 | 5 days supply | Qty: 6 | Fill #1

## 2019-10-22 MED FILL — BENZONATATE 100 MG CAPS: 100 | 7 days supply | Qty: 42 | Fill #1

## 2019-11-01 ENCOUNTER — Other Ambulatory Visit: Payer: Self-pay | Admitting: Obstetrics

## 2019-11-01 DIAGNOSIS — Z111 Encounter for screening for respiratory tuberculosis: Secondary | ICD-10-CM

## 2019-11-05 LAB — QUANTIFERON-TB GOLD PLUS
QuantiFERON Mitogen Value: >10 IU/mL
QuantiFERON Nil Value: 0.04 IU/mL
QuantiFERON TB1 Ag Value: 0.04 IU/mL
QuantiFERON TB2 Ag Value: 0.04 IU/mL
QuantiFERON-TB Gold Plus: NEGATIVE

## 2019-11-07 MED FILL — TINIDAZOLE 500 MG TAB: 500 | 5 days supply | Qty: 10 | Fill #1

## 2019-11-07 MED FILL — BENZONATATE 100 MG CAPS: 100 | 7 days supply | Qty: 42 | Fill #0

## 2019-11-14 ENCOUNTER — Other Ambulatory Visit: Payer: Self-pay | Admitting: Obstetrics

## 2019-11-14 DIAGNOSIS — R430 Anosmia: Secondary | ICD-10-CM

## 2019-11-14 MED ORDER — STRESS FORMULA/ZINC PO TABS: 1.0000 | ORAL_TABLET | Freq: Every day | ORAL | 11 refills | Status: DC

## 2019-11-15 ENCOUNTER — Other Ambulatory Visit: Payer: Self-pay | Admitting: Obstetrics

## 2019-11-15 DIAGNOSIS — B373 Candidiasis of vulva and vagina: Secondary | ICD-10-CM

## 2019-11-15 DIAGNOSIS — B3731 Acute candidiasis of vulva and vagina: Secondary | ICD-10-CM

## 2019-11-15 MED ORDER — FLUCONAZOLE 150 MG PO TABS: 150.0000 mg | ORAL_TABLET | Freq: Once | ORAL | 2 refills | Status: DC

## 2019-11-15 MED FILL — FLUCONAZOLE 150 MG TABS: 150 | 1 days supply | Qty: 1 | Fill #0

## 2019-11-23 MED FILL — LISINOPRIL-HCTZ 20-25 MG TA: 20-25 | 30 days supply | Qty: 30 | Fill #10

## 2019-11-23 MED FILL — AMLODIPINE BESYLATE 5 MG TA: 5 | 30 days supply | Qty: 30 | Fill #10

## 2019-12-04 MED FILL — XULANE PATCH: 150-35 | 28 days supply | Qty: 3 | Fill #1

## 2019-12-19 DIAGNOSIS — Z0000 Encounter for general adult medical examination without abnormal findings: Secondary | ICD-10-CM | POA: Diagnosis not present

## 2019-12-19 DIAGNOSIS — Z0184 Encounter for antibody response examination: Secondary | ICD-10-CM | POA: Diagnosis not present

## 2019-12-19 DIAGNOSIS — I10 Essential (primary) hypertension: Secondary | ICD-10-CM | POA: Diagnosis not present

## 2019-12-19 DIAGNOSIS — Z23 Encounter for immunization: Secondary | ICD-10-CM | POA: Diagnosis not present

## 2019-12-19 MED FILL — OLMSRTN-AMLDPN-HCTZ 40-5-12: 40-5-12.5 | 30 days supply | Qty: 30 | Fill #0

## 2019-12-24 ENCOUNTER — Other Ambulatory Visit: Payer: Self-pay | Admitting: Obstetrics

## 2019-12-24 DIAGNOSIS — K121 Other forms of stomatitis: Secondary | ICD-10-CM

## 2019-12-24 MED FILL — AMLODIPINE BESYLATE 5 MG TA: 5 | 90 days supply | Qty: 90 | Fill #0

## 2019-12-24 MED FILL — LISINOPRIL-HCTZ 20-25 MG TA: 20-25 | 90 days supply | Qty: 90 | Fill #0

## 2019-12-25 MED FILL — MAGIC MW (LID/NYS/MAA/BEN): 3 days supply | Qty: 60 | Fill #0

## 2019-12-26 MED FILL — XULANE PATCH: 150-35 | 28 days supply | Qty: 3 | Fill #2

## 2019-12-28 DIAGNOSIS — L109 Pemphigus, unspecified: Secondary | ICD-10-CM | POA: Diagnosis not present

## 2020-01-03 DIAGNOSIS — I10 Essential (primary) hypertension: Secondary | ICD-10-CM | POA: Diagnosis not present

## 2020-01-11 DIAGNOSIS — L109 Pemphigus, unspecified: Secondary | ICD-10-CM | POA: Diagnosis not present

## 2020-01-25 DIAGNOSIS — H52223 Regular astigmatism, bilateral: Secondary | ICD-10-CM | POA: Diagnosis not present

## 2020-03-04 MED FILL — FLUCONAZOLE 150 MG TABS: 150 | 1 days supply | Qty: 1 | Fill #1

## 2020-03-04 MED FILL — TINIDAZOLE 500 MG TAB: 500 | 5 days supply | Qty: 10 | Fill #2

## 2020-03-31 MED FILL — AMLODIPINE BESYLATE 5 MG TA: 5 | 90 days supply | Qty: 90 | Fill #1

## 2020-03-31 MED FILL — LISINOPRIL-HCTZ 20-25 MG TA: 20-25 | 90 days supply | Qty: 90 | Fill #1

## 2020-05-27 ENCOUNTER — Other Ambulatory Visit: Payer: Self-pay | Admitting: Obstetrics

## 2020-05-27 DIAGNOSIS — B3731 Acute candidiasis of vulva and vagina: Secondary | ICD-10-CM

## 2020-05-27 DIAGNOSIS — B373 Candidiasis of vulva and vagina: Secondary | ICD-10-CM

## 2020-05-27 DIAGNOSIS — N3000 Acute cystitis without hematuria: Secondary | ICD-10-CM

## 2020-05-27 MED ORDER — METRONIDAZOLE 500 MG PO TABS: 500.0000 mg | ORAL_TABLET | Freq: Two times a day (BID) | ORAL | 2 refills | Status: DC

## 2020-05-27 MED ORDER — FLUCONAZOLE 200 MG PO TABS: 200.0000 mg | ORAL_TABLET | ORAL | 2 refills | Status: DC

## 2020-05-27 MED ORDER — NITROFURANTOIN MONOHYD MACRO 100 MG PO CAPS: 100.0000 mg | ORAL_CAPSULE | Freq: Two times a day (BID) | ORAL | 2 refills | Status: DC

## 2020-05-27 MED FILL — NITROFURANTOIN MONO-MCR 100: 100 | 7 days supply | Qty: 14 | Fill #0

## 2020-05-27 MED FILL — FLUCONAZOLE 200 MG TAB: 200 | 9 days supply | Qty: 3 | Fill #0

## 2020-05-27 MED FILL — METRONIDAZOLE 500 MG TABS: 500 | 7 days supply | Qty: 14 | Fill #0

## 2020-06-14 MED FILL — FLUARIX QUADRIVALENT 0.5 ML: 0.5 | 1 days supply | Qty: 1 | Fill #0

## 2020-06-14 MED FILL — AMOXICILLIN 500 MG CAPSULE: 500 | 7 days supply | Qty: 21 | Fill #0

## 2020-06-23 DIAGNOSIS — L102 Pemphigus foliaceous: Secondary | ICD-10-CM | POA: Diagnosis not present

## 2020-06-24 MED FILL — AMOXICILLIN 500 MG CAPSULE: 500 | 7 days supply | Qty: 21 | Fill #0

## 2020-06-28 MED FILL — FLUCONAZOLE 200 MG TAB: 200 | 9 days supply | Qty: 3 | Fill #1

## 2020-07-02 ENCOUNTER — Other Ambulatory Visit (HOSPITAL_COMMUNITY): Payer: Self-pay | Admitting: Physician Assistant

## 2020-07-02 MED FILL — AMLODIPINE BESYLATE 5 MG TA: 5 | 90 days supply | Qty: 90 | Fill #0

## 2020-07-02 MED FILL — LISINOPRIL-HCTZ 20-25 MG TA: 20-25 | 90 days supply | Qty: 90 | Fill #0

## 2020-07-29 MED FILL — valACYclovir HCL 1 GM TABS: 1 | 20 days supply | Qty: 20 | Fill #2

## 2020-08-09 MED FILL — FLUCONAZOLE 150 MG TABS: 150 | 1 days supply | Qty: 1 | Fill #2

## 2020-09-18 ENCOUNTER — Other Ambulatory Visit: Payer: Self-pay | Admitting: Obstetrics

## 2020-09-18 DIAGNOSIS — B3731 Acute candidiasis of vulva and vagina: Secondary | ICD-10-CM

## 2020-09-18 DIAGNOSIS — B373 Candidiasis of vulva and vagina: Secondary | ICD-10-CM

## 2020-09-18 DIAGNOSIS — N3000 Acute cystitis without hematuria: Secondary | ICD-10-CM

## 2020-09-18 MED ORDER — METRONIDAZOLE 500 MG PO TABS: 500.0000 mg | ORAL_TABLET | Freq: Two times a day (BID) | ORAL | 2 refills | Status: DC

## 2020-09-18 MED ORDER — FLUCONAZOLE 200 MG PO TABS: 200.0000 mg | ORAL_TABLET | ORAL | 2 refills | Status: DC

## 2020-09-18 MED FILL — FLUCONAZOLE 200 MG TAB: 200 | 9 days supply | Qty: 3 | Fill #0

## 2020-09-18 MED FILL — METRONIDAZOLE 500 MG TABS: 500 | 7 days supply | Qty: 14 | Fill #0

## 2020-10-04 NOTE — L&D Delivery Note (Signed)
Delivery Note At 12:14 PM a viable and healthy female was delivered via VBAC, Spontaneous (Presentation:Occiput Anterior).  APGAR: 8, 9; weight  .   Placenta status:  spontaneous, intact .  Cord:   with the following complications:  none.  Cord pH: na  Anesthesia: Epidural Episiotomy: None Lacerations:  first degree and right periurethral Suture Repair: 3.0 vicryl rapide Est. Blood Loss (mL):  50  Mom to postpartum.  Baby to Couplet care / Skin to Skin.  Meiko Stranahan J 07/24/2021, 1:14 PM

## 2020-10-08 MED FILL — LISINOPRIL-HCTZ 20-25 MG TA: 20-25 | 90 days supply | Qty: 90 | Fill #1

## 2020-10-08 MED FILL — AMLODIPINE BESYLATE 5 MG TA: 5 | 90 days supply | Qty: 90 | Fill #1

## 2020-10-23 ENCOUNTER — Other Ambulatory Visit (HOSPITAL_COMMUNITY): Payer: Self-pay | Admitting: Obstetrics and Gynecology

## 2020-10-23 DIAGNOSIS — Z1159 Encounter for screening for other viral diseases: Secondary | ICD-10-CM | POA: Diagnosis not present

## 2020-10-23 DIAGNOSIS — Z01419 Encounter for gynecological examination (general) (routine) without abnormal findings: Secondary | ICD-10-CM | POA: Diagnosis not present

## 2020-10-23 DIAGNOSIS — I10 Essential (primary) hypertension: Secondary | ICD-10-CM | POA: Diagnosis not present

## 2020-10-23 DIAGNOSIS — Z124 Encounter for screening for malignant neoplasm of cervix: Secondary | ICD-10-CM | POA: Diagnosis not present

## 2020-10-23 DIAGNOSIS — Z01411 Encounter for gynecological examination (general) (routine) with abnormal findings: Secondary | ICD-10-CM | POA: Diagnosis not present

## 2020-10-23 DIAGNOSIS — Z6826 Body mass index (BMI) 26.0-26.9, adult: Secondary | ICD-10-CM | POA: Diagnosis not present

## 2020-10-23 DIAGNOSIS — Z114 Encounter for screening for human immunodeficiency virus [HIV]: Secondary | ICD-10-CM | POA: Diagnosis not present

## 2020-10-23 DIAGNOSIS — L918 Other hypertrophic disorders of the skin: Secondary | ICD-10-CM | POA: Diagnosis not present

## 2020-10-23 DIAGNOSIS — Z113 Encounter for screening for infections with a predominantly sexual mode of transmission: Secondary | ICD-10-CM | POA: Diagnosis not present

## 2020-10-23 DIAGNOSIS — Z118 Encounter for screening for other infectious and parasitic diseases: Secondary | ICD-10-CM | POA: Diagnosis not present

## 2020-10-23 MED FILL — METRONIDAZOLE 500 MG TABS: 500 | 7 days supply | Qty: 14 | Fill #0

## 2020-10-23 MED FILL — FLUCONAZOLE 150 MG TABS: 150 | 7 days supply | Qty: 3 | Fill #0

## 2020-10-24 DIAGNOSIS — D280 Benign neoplasm of vulva: Secondary | ICD-10-CM | POA: Diagnosis not present

## 2020-10-24 DIAGNOSIS — L918 Other hypertrophic disorders of the skin: Secondary | ICD-10-CM | POA: Diagnosis not present

## 2020-11-27 DIAGNOSIS — Z3200 Encounter for pregnancy test, result unknown: Secondary | ICD-10-CM | POA: Diagnosis not present

## 2020-11-27 DIAGNOSIS — Z3689 Encounter for other specified antenatal screening: Secondary | ICD-10-CM | POA: Diagnosis not present

## 2020-11-27 LAB — OB RESULTS CONSOLE ABO/RH: RH Type: POSITIVE

## 2020-11-27 LAB — OB RESULTS CONSOLE ANTIBODY SCREEN: Antibody Screen: NEGATIVE

## 2020-11-27 MED FILL — METRONIDAZOLE 500 MG TABS: 500 | 7 days supply | Qty: 14 | Fill #1

## 2020-11-28 ENCOUNTER — Other Ambulatory Visit (HOSPITAL_COMMUNITY): Payer: Self-pay | Admitting: Physician Assistant

## 2020-11-28 MED FILL — LABETALOL HCL 100MG TABLET: 100 | 30 days supply | Qty: 60 | Fill #0

## 2020-12-15 ENCOUNTER — Other Ambulatory Visit (HOSPITAL_COMMUNITY): Payer: Self-pay | Admitting: Physician Assistant

## 2020-12-15 DIAGNOSIS — I10 Essential (primary) hypertension: Secondary | ICD-10-CM | POA: Diagnosis not present

## 2020-12-24 DIAGNOSIS — Z3201 Encounter for pregnancy test, result positive: Secondary | ICD-10-CM | POA: Diagnosis not present

## 2020-12-26 ENCOUNTER — Other Ambulatory Visit: Payer: Self-pay

## 2020-12-26 ENCOUNTER — Other Ambulatory Visit (HOSPITAL_BASED_OUTPATIENT_CLINIC_OR_DEPARTMENT_OTHER): Payer: Self-pay

## 2020-12-26 NOTE — Progress Notes (Addendum)
Insurance will not cover PNV gummies  Pt will purchase an OTC PNV

## 2020-12-31 MED FILL — LABETALOL HCL 100MG TABLET: 100 | 30 days supply | Qty: 60 | Fill #1

## 2021-01-12 DIAGNOSIS — Z3A11 11 weeks gestation of pregnancy: Secondary | ICD-10-CM | POA: Diagnosis not present

## 2021-01-12 DIAGNOSIS — N898 Other specified noninflammatory disorders of vagina: Secondary | ICD-10-CM | POA: Diagnosis not present

## 2021-01-12 DIAGNOSIS — Z36 Encounter for antenatal screening for chromosomal anomalies: Secondary | ICD-10-CM | POA: Diagnosis not present

## 2021-01-12 DIAGNOSIS — O10011 Pre-existing essential hypertension complicating pregnancy, first trimester: Secondary | ICD-10-CM | POA: Diagnosis not present

## 2021-01-12 DIAGNOSIS — Z3689 Encounter for other specified antenatal screening: Secondary | ICD-10-CM | POA: Diagnosis not present

## 2021-01-21 DIAGNOSIS — Z13220 Encounter for screening for lipoid disorders: Secondary | ICD-10-CM | POA: Diagnosis not present

## 2021-01-21 DIAGNOSIS — I10 Essential (primary) hypertension: Secondary | ICD-10-CM | POA: Diagnosis not present

## 2021-01-21 DIAGNOSIS — Z0000 Encounter for general adult medical examination without abnormal findings: Secondary | ICD-10-CM | POA: Diagnosis not present

## 2021-01-28 ENCOUNTER — Inpatient Hospital Stay (HOSPITAL_COMMUNITY)
Admission: AD | Admit: 2021-01-28 | Discharge: 2021-01-28 | Disposition: A | Discharge status: Home / Self Care | Payer: 59 | Attending: Obstetrics | Admitting: Obstetrics

## 2021-01-28 ENCOUNTER — Other Ambulatory Visit: Payer: Self-pay

## 2021-01-28 ENCOUNTER — Encounter (HOSPITAL_COMMUNITY): Payer: Self-pay | Admitting: Obstetrics

## 2021-01-28 DIAGNOSIS — Z3A13 13 weeks gestation of pregnancy: Secondary | ICD-10-CM | POA: Diagnosis not present

## 2021-01-28 DIAGNOSIS — O10011 Pre-existing essential hypertension complicating pregnancy, first trimester: Secondary | ICD-10-CM | POA: Diagnosis not present

## 2021-01-28 DIAGNOSIS — I10 Essential (primary) hypertension: Secondary | ICD-10-CM

## 2021-01-28 DIAGNOSIS — O10911 Unspecified pre-existing hypertension complicating pregnancy, first trimester: Secondary | ICD-10-CM | POA: Diagnosis not present

## 2021-01-28 DIAGNOSIS — Z79899 Other long term (current) drug therapy: Secondary | ICD-10-CM | POA: Insufficient documentation

## 2021-01-28 HISTORY — DX: Essential (primary) hypertension: I10

## 2021-01-28 HISTORY — DX: Herpesviral infection, unspecified: B00.9

## 2021-01-28 LAB — URINALYSIS, ROUTINE W REFLEX MICROSCOPIC
Bacteria, UA: NONE SEEN
Bilirubin Urine: NEGATIVE
Glucose, UA: NEGATIVE mg/dL
Ketones, ur: NEGATIVE mg/dL
Leukocytes,Ua: NEGATIVE
Nitrite: NEGATIVE
Protein, ur: NEGATIVE mg/dL
Specific Gravity, Urine: 1.002 — ABNORMAL LOW (ref 1.005–1.030)
pH: 7 (ref 5.0–8.0)

## 2021-01-28 LAB — CBC
HCT: 36.2 % (ref 36.0–46.0)
Hemoglobin: 12.1 g/dL (ref 12.0–15.0)
MCH: 27.6 pg (ref 26.0–34.0)
MCHC: 33.4 g/dL (ref 30.0–36.0)
MCV: 82.6 fL (ref 80.0–100.0)
Platelets: 237 10*3/uL (ref 150–400)
RBC: 4.38 MIL/uL (ref 3.87–5.11)
RDW: 12.3 % (ref 11.5–15.5)
WBC: 6.5 10*3/uL (ref 4.0–10.5)
nRBC: 0 % (ref 0.0–0.2)

## 2021-01-28 LAB — COMPREHENSIVE METABOLIC PANEL
ALT: 17 U/L (ref 0–44)
AST: 18 U/L (ref 15–41)
Albumin: 3.5 g/dL (ref 3.5–5.0)
Alkaline Phosphatase: 49 U/L (ref 38–126)
Anion gap: 9 (ref 5–15)
BUN: 5 mg/dL — ABNORMAL LOW (ref 6–20)
CO2: 24 mmol/L (ref 22–32)
Calcium: 8.8 mg/dL — ABNORMAL LOW (ref 8.9–10.3)
Chloride: 100 mmol/L (ref 98–111)
Creatinine, Ser: 0.57 mg/dL (ref 0.44–1.00)
GFR, Estimated: >60 mL/min (ref >60)
Glucose, Bld: 90 mg/dL (ref 70–99)
Potassium: 3.3 mmol/L — ABNORMAL LOW (ref 3.5–5.1)
Sodium: 133 mmol/L — ABNORMAL LOW (ref 135–145)
Total Bilirubin: 0.4 mg/dL (ref 0.3–1.2)
Total Protein: 6.2 g/dL — ABNORMAL LOW (ref 6.5–8.1)

## 2021-01-28 LAB — PROTEIN / CREATININE RATIO, URINE
Creatinine, Urine: 16.44 mg/dL
Total Protein, Urine: <6 mg/dL

## 2021-01-28 MED ORDER — NIFEDIPINE ER OSMOTIC RELEASE 30 MG PO TB24
30.0000 mg | ORAL_TABLET | Freq: Every day | ORAL | 3 refills | Status: DC
  Filled 2021-01-28: qty 30, 30d supply, fill #0
  Filled 2021-02-19 – 2021-02-23 (×2): qty 30, 30d supply, fill #1
  Filled 2021-03-26: qty 30, 30d supply, fill #2
  Filled 2021-04-22: qty 30, 30d supply, fill #3

## 2021-01-28 MED ORDER — LABETALOL HCL 5 MG/ML IV SOLN
20.0000 mg | INTRAVENOUS | Status: DC | PRN
  Administered 2021-01-28: 20 mg via INTRAVENOUS
  Filled 2021-01-28: qty 4

## 2021-01-28 MED ORDER — LABETALOL HCL 5 MG/ML IV SOLN
80.0000 mg | INTRAVENOUS | Status: DC | PRN
  Administered 2021-01-28: 80 mg via INTRAVENOUS
  Filled 2021-01-28: qty 16

## 2021-01-28 MED ORDER — HYDRALAZINE HCL 20 MG/ML IJ SOLN: 10.0000 mg | INTRAMUSCULAR | Status: DC | PRN

## 2021-01-28 MED ORDER — LABETALOL HCL 200 MG PO TABS
200.0000 mg | ORAL_TABLET | Freq: Two times a day (BID) | ORAL | 11 refills | Status: DC
  Filled 2021-01-28: qty 60, 30d supply, fill #0
  Filled 2021-02-19 – 2021-03-19 (×2): qty 60, 30d supply, fill #1

## 2021-01-28 MED ORDER — LABETALOL HCL 100 MG PO TABS
300.0000 mg | ORAL_TABLET | Freq: Once | ORAL | Status: AC
  Administered 2021-01-28: 300 mg via ORAL
  Filled 2021-01-28: qty 3

## 2021-01-28 MED ORDER — LABETALOL HCL 5 MG/ML IV SOLN
40.0000 mg | INTRAVENOUS | Status: DC | PRN
  Administered 2021-01-28: 40 mg via INTRAVENOUS
  Filled 2021-01-28: qty 8

## 2021-01-28 MED ORDER — LACTATED RINGERS IV BOLUS
1000.0000 mL | Freq: Once | INTRAVENOUS | Status: AC
  Administered 2021-01-28: 1000 mL via INTRAVENOUS

## 2021-01-28 NOTE — MAU Note (Signed)
Presents for BP evaluation.  States has Hx of CHTN, on Labetalol 100mg  bid, took last 200 mg @ 1600.  States BP possibly  elevated since weekend secondary had H/A unrelieved with Tylenol.  Took BP this evening and BP 184/130 and 186/114.  Denies VB or LOF.

## 2021-01-28 NOTE — MAU Provider Note (Addendum)
History     CSN: [id number]  Arrival date and time: 01/28/21 0102   Event Date/Time   First Provider Initiated Contact with Patient 01/28/21 1938      Chief Complaint  Patient presents with  . BP Eval   Chelsea Middleton is a 35 y.o. [id number] at [redacted]w[redacted]d who receives care at [facility].  She presents today for BP Eval.  She states on Sunday she had a headache that lasted through Monday despite tylenol dosing.  She reports it resolved without intervention.  She states she was "thinking about her headache" and took her blood pressure. She states it was 180s/100s.  She reports taking labetalol 100mg  BID, but took 200mg  at 4pm.  She states she retook her blood pressure at 1745 and it was 186/114.    OB History    Gravida  4   Para  1   Term  1   Preterm      AB  2   Living  1     SAB      IAB  2   Ectopic      Multiple      Live Births  1           Past Medical History:  Diagnosis Date  . Asthma   . Pemphigus foliaceous     Past Surgical History:  Procedure Laterality Date  . HERNIA REPAIR    . TONSILLECTOMY      History reviewed. No pertinent family history.  Social History   Tobacco Use  . Smoking status: Never Smoker  . Smokeless tobacco: Never Used  Vaping Use  . Vaping Use: Never used  Substance Use Topics  . Alcohol use: Not Currently  . Drug use: No    Allergies: No Known Allergies  Medications Prior to Admission  Medication Sig Dispense Refill Last Dose  . amLODipine (NORVASC) 5 MG tablet Take 1 tablet (5 mg total) by mouth daily. 30 tablet 11 Past Month at Unknown time  . azithromycin (ZITHROMAX Z-PAK) 250 MG tablet Take 2 tablets po load, then take 1 tablet po daily 6 tablet 1 Past Month at Unknown time  . clobetasol cream (TEMOVATE) 4.03 % Apply 1 application topically 2 (two) times daily as needed (rash).   Past Week at Unknown time  . labetalol (NORMODYNE) 100 MG tablet TAKE 1 TABLET BY MOUTH 2 TIMES DAILY 180 tablet 1  01/28/2021 at Unknown time  . amLODipine (NORVASC) 5 MG tablet TAKE 1 TABLET BY MOUTH DAILY 90 tablet 1   . amoxicillin-clavulanate (AUGMENTIN) 875-125 MG tablet Take 1 tablet by mouth 2 (two) times daily. 14 tablet 1   . B Complex-C-E-Zn (B COMPLEX-C-E-ZINC) tablet Take 1 tablet by mouth daily. 30 tablet 11   . benzonatate (TESSALON PERLES) 100 MG capsule Take 2 capsules (200 mg total) by mouth 3 (three) times daily as needed for cough. 42 capsule 2   . carvedilol (COREG) 12.5 MG tablet Take 1 tablet (12.5 mg total) by mouth 2 (two) times daily with a meal. 60 tablet 11   . fluconazole (DIFLUCAN) 150 MG tablet TAKE 1 TABLET BY MOUTH ON DAYS 1, 4, AND 7 3 tablet 0   . fluconazole (DIFLUCAN) 200 MG tablet TAKE 1 TABLET BY MOUTH EVERY 3 DAYS 3 tablet 2   . HYDROcodone-acetaminophen (NORCO/VICODIN) 5-325 MG per tablet Take 1 tablet by mouth every 4 (four) hours as needed for moderate pain. 12 tablet 0   . ibuprofen (ADVIL,MOTRIN) 600  MG tablet Take 1 tablet (600 mg total) by mouth every 8 (eight) hours as needed. 15 tablet 0   . labetalol (NORMODYNE) 100 MG tablet TAKE 1 TABLET BY MOUTH 2 TIMES DAILY 60 tablet 1   . lisinopril-hydrochlorothiazide (PRINZIDE,ZESTORETIC) 20-25 MG tablet Take 1 tablet by mouth daily. 30 tablet 11 More than a month at Unknown time  . lisinopril-hydrochlorothiazide (ZESTORETIC) 20-25 MG tablet TAKE 1 TABLET BY MOUTH DAILY 90 tablet 1   . loratadine (CLARITIN) 10 MG tablet Take 1 tablet (10 mg total) by mouth daily. 30 tablet 11   . metroNIDAZOLE (FLAGYL) 500 MG tablet TAKE 1 TABLET BY MOUTH TWICE A DAY 14 tablet 2 More than a month at Unknown time  . metroNIDAZOLE (FLAGYL) 500 MG tablet TAKE 1 TABLET BY MOUTH EVERY 12 HOURS FOR 7 DAYS 14 tablet 0   . metroNIDAZOLE (FLAGYL) 500 MG tablet TAKE 1 TABLET BY MOUTH 2 TIMES DAILY 14 tablet 2   . naphazoline-pheniramine (NAPHCON-A) 0.025-0.3 % ophthalmic solution Place 1 drop into both eyes 4 (four) times daily as needed for  irritation.     . naproxen sodium (ANAPROX) 220 MG tablet Take 440 mg by mouth 2 (two) times daily as needed (pain).     . niacin 250 MG tablet Take 250 mg by mouth daily as needed (pain).     . nitrofurantoin, macrocrystal-monohydrate, (MACROBID) 100 MG capsule Take 1 capsule (100 mg total) by mouth 2 (two) times daily. 14 capsule 2   . Omega-3 Fatty Acids (FISH OIL PO) Take 1 capsule by mouth 2 (two) times daily.     Marland Kitchen oseltamivir (TAMIFLU) 75 MG capsule Take 1 capsule (75 mg total) by mouth 2 (two) times daily. 10 capsule 0   . scopolamine (TRANSDERM-SCOP, 1.5 MG,) 1 MG/3DAYS Place 1 patch (1.5 mg total) onto the skin every 3 (three) days. 4 patch 0   . tinidazole (TINDAMAX) 500 MG tablet Take 2 tablets (1,000 mg total) by mouth daily with breakfast. 10 tablet 2   . triamterene-hydrochlorothiazide (DYAZIDE) 37.5-25 MG capsule Take 1 each (1 capsule total) by mouth daily. 30 capsule 11     Review of Systems  Genitourinary: Negative for difficulty urinating, dysuria, vaginal bleeding and vaginal discharge.   Physical Exam   Blood pressure (!) 167/107, pulse 91, temperature 98.1 F (36.7 C), temperature source Oral, resp. rate 20, height 5\' 2"  (1.575 m), weight 68.7 kg, SpO2 100 %. Vitals:   01/28/21 1900 01/28/21 1916 01/28/21 1931 01/28/21 2014  BP: (!) 167/107 (!) 159/106 (!) 161/107 (!) 180/111  Pulse: 91 87 83   Resp:      Temp:      TempSrc:      SpO2:      Weight:      Height:        Physical Exam Constitutional:      General: She is not in acute distress.    Appearance: Normal appearance. She is not toxic-appearing.  HENT:     Head: Normocephalic and atraumatic.  Eyes:     Conjunctiva/sclera: Conjunctivae normal.  Pulmonary:     Effort: Pulmonary effort is normal. No respiratory distress.  Musculoskeletal:        General: Normal range of motion.     Right lower leg: No edema.     Left lower leg: No edema.  Skin:    General: Skin is warm and dry.  Neurological:      Mental Status: She is alert and oriented to  person, place, and time.  Psychiatric:        Mood and Affect: Mood normal.        Behavior: Behavior normal.        Thought Content: Thought content normal.     MAU Course  Procedures Results for orders placed or performed during the hospital encounter of 01/28/21 (from the past 24 hour(s))  Urinalysis, Routine w reflex microscopic Urine, Clean Catch     Status: Abnormal   Collection Time: 01/28/21  6:41 PM  Result Value Ref Range   Color, Urine COLORLESS (A) YELLOW   APPearance CLEAR CLEAR   Specific Gravity, Urine 1.002 (L) 1.005 - 1.030   pH 7.0 5.0 - 8.0   Glucose, UA NEGATIVE NEGATIVE mg/dL   Hgb urine dipstick SMALL (A) NEGATIVE   Bilirubin Urine NEGATIVE NEGATIVE   Ketones, ur NEGATIVE NEGATIVE mg/dL   Protein, ur NEGATIVE NEGATIVE mg/dL   Nitrite NEGATIVE NEGATIVE   Leukocytes,Ua NEGATIVE NEGATIVE   Bacteria, UA NONE SEEN NONE SEEN   Squamous Epithelial / LPF 0-5 0 - 5    MDM Physical Exam Labs: CBC, CMP, PC Ratio Measure BPQ15 min Antihypertensives Assessment and Plan  35 year old, [id number] SIUP at 13.4 weeks CHTN  -Discussed need for possible increase or change in medication regime. -Reviewed comparison of labetalol vs procardia usage in pregnancy. -BP 180/111 -Informed that blood pressure needs to be decreased before we will discharge. -Discussed giving dosage of of labetalol now and possible IV. -Dr. Kathlen Mody consulted and advised.  *Start Labetalol protocol *Give 300mg  Labetalol oral now. *Collect labs for baseline. -Orders placed -Nurse and patient updated on POC.  Maryann Conners 01/28/2021, 7:38 PM   Reassessment (8:31 PM) -Report given to A. Sylvester Harder, [place] MSN, CNM Advanced Practice Provider, [facility] from [place] labs resulted and overall unremarkable. Patient asymptomatic. Given gestational age patient is uncontrolled chronic  hypertension. Discussed tighter BP control, recommended increased labetalol dosing from 100 mg BID to 200 mg BID and addition of procardia xl 30 mg daily. Patient with BP check tomorrow at [facility], encouraged to talk to them. Discussed if more severe BP overnight can take extra labetalol dosage.  Arrie Senate, MD OB Fellow, [place] [facility] [place] 01/28/2021 10:30 PM

## 2021-01-28 NOTE — Discharge Instructions (Signed)
-discuss hypertension regimen with provider -recommend labetalol 200 mg twice daily -add procardia 30mg  xl daily, prescription sent to pharmacy  Hypertension During Pregnancy Hypertension is also called high blood pressure. High blood pressure means that the force of the blood moving in your body is high enough to cause problems for you and your baby. Different types of high blood pressure can happen during pregnancy. The types are:  High blood pressure before you got pregnant. This is called chronic hypertension.  This can continue during your pregnancy. Your doctor will want to keep checking your blood pressure. You may need medicine to control your blood pressure while you are pregnant. You will need follow-up visits after you have your baby.  High blood pressure that goes up during pregnancy when it was normal before. This is called gestational hypertension. It will often get better after you have your baby, but your doctor will need to watch your blood pressure to make sure that it is getting better.  You may develop high blood pressure after giving birth. This is called postpartum hypertension. This often occurs within 48 hours after childbirth but may occur up to 6 weeks after giving birth. Very high blood pressure during pregnancy is an emergency that needs treatment right away. How does this affect me? If you have high blood pressure during pregnancy, you have a higher chance of developing high blood pressure:  As you get older.  If you get pregnant again. In some cases, high blood pressure during pregnancy can cause:  Stroke.  Heart attack.  Damage to the kidneys, lungs, or liver.  Preeclampsia.  HELLP syndrome.  Seizures.  Problems with the placenta. How does this affect my baby? Your baby may:  Be born early.  Not weigh as much as he or she should.  Not handle labor well, leading to a C-section. This condition may also result in a baby's death before birth  (stillbirth). What are the risks?  Having high blood pressure during a past pregnancy.  Being overweight.  Being age 31 or older.  Being pregnant for the first time.  Being pregnant with more than one baby.  Becoming pregnant using fertility methods, such as IVF.  Having other problems, such as diabetes or kidney disease. What can I do to lower my risk?  Keep a healthy weight.  Eat a healthy diet.  Follow what your doctor tells you about treating any medical problems that you had before you got pregnant. It is very important to go to all of your doctor visits. Your doctor will check your blood pressure and make sure that your pregnancy is progressing as it should. Treatment should start early if a problem is found.   How is this treated? Treatment for high blood pressure during pregnancy can vary. It depends on the type of high blood pressure you have and how serious it is.  If you were taking medicine for your blood pressure before you got pregnant, talk with your doctor. You may need to change the medicine during pregnancy if it is not safe for your baby.  If your blood pressure goes up during pregnancy, your doctor may order medicine to treat this.  If you are at risk for preeclampsia, your doctor may tell you to take a low-dose aspirin while you are pregnant.  If you have very high blood pressure, you may need to stay in the hospital so you and your baby can be watched closely. You may also need to take medicine to  lower your blood pressure.  In some cases, if your condition gets worse, you may need to have your baby early. Follow these instructions at home: Eating and drinking  Drink enough fluid to keep your pee (urine) pale yellow.  Avoid caffeine.   Lifestyle  Do not smoke or use any products that contain nicotine or tobacco. If you need help quitting, ask your doctor.  Do not use alcohol or drugs.  Avoid stress.  Rest and get plenty of sleep.  Regular  exercise can help. Ask your doctor what kinds of exercise are best for you. General instructions  Take over-the-counter and prescription medicines only as told by your doctor.  Keep all prenatal and follow-up visits. Contact a doctor if:  You have symptoms that your doctor told you to watch for, such as: ? Headaches. ? A feeling like you may vomit (nausea). ? Vomiting. ? Belly (abdominal) pain. ? Feeling dizzy or light-headed. Get help right away if:  You have symptoms of serious problems, such as: ? Very bad belly pain that does not get better with treatment. ? A very bad headache that does not get better. ? Blurry vision. ? Double vision. ? Vomiting that does not get better. ? Sudden, fast weight gain. ? Sudden swelling in your hands, ankles, or face. ? Bleeding from your vagina. ? Blood in your pee. ? Shortness of breath. ? Chest pain. ? Weakness on one side of your body. ? Trouble talking.  Your baby is not moving as much as usual. These symptoms may be an emergency. Get help right away. Call your local emergency services (911 in the [place]  Do not wait to see if the symptoms will go away.  Do not drive yourself to the hospital. Summary  High blood pressure is also called hypertension.  High blood pressure means that the force of the blood moving in your body is high enough to cause problems for you and your baby.  Get help right away if you have symptoms of serious problems due to high blood pressure.  Keep all prenatal and follow-up visits. This information is not intended to replace advice given to you by your health care provider. Make sure you discuss any questions you have with your health care provider. Document Revised: 06/12/2020 Document Reviewed: 06/12/2020 Elsevier Patient Education  2021 [employer].

## 2021-01-29 ENCOUNTER — Other Ambulatory Visit (HOSPITAL_COMMUNITY): Payer: Self-pay

## 2021-02-12 DIAGNOSIS — Z3A15 15 weeks gestation of pregnancy: Secondary | ICD-10-CM | POA: Diagnosis not present

## 2021-02-12 DIAGNOSIS — O10112 Pre-existing hypertensive heart disease complicating pregnancy, second trimester: Secondary | ICD-10-CM | POA: Diagnosis not present

## 2021-02-12 DIAGNOSIS — Z361 Encounter for antenatal screening for raised alphafetoprotein level: Secondary | ICD-10-CM | POA: Diagnosis not present

## 2021-02-12 LAB — OB RESULTS CONSOLE GC/CHLAMYDIA
Chlamydia: NEGATIVE
Gonorrhea: NEGATIVE

## 2021-02-19 ENCOUNTER — Other Ambulatory Visit (HOSPITAL_COMMUNITY): Payer: Self-pay

## 2021-02-19 MED ORDER — LABETALOL HCL 300 MG PO TABS
ORAL_TABLET | ORAL | 0 refills | Status: DC
  Filled 2021-02-19: qty 60, 30d supply, fill #0

## 2021-02-20 ENCOUNTER — Other Ambulatory Visit (HOSPITAL_COMMUNITY): Payer: Self-pay

## 2021-02-21 ENCOUNTER — Other Ambulatory Visit (HOSPITAL_COMMUNITY): Payer: Self-pay

## 2021-02-23 ENCOUNTER — Other Ambulatory Visit (HOSPITAL_COMMUNITY): Payer: Self-pay

## 2021-02-23 DIAGNOSIS — H52223 Regular astigmatism, bilateral: Secondary | ICD-10-CM | POA: Diagnosis not present

## 2021-03-13 DIAGNOSIS — O10112 Pre-existing hypertensive heart disease complicating pregnancy, second trimester: Secondary | ICD-10-CM | POA: Diagnosis not present

## 2021-03-13 DIAGNOSIS — Z363 Encounter for antenatal screening for malformations: Secondary | ICD-10-CM | POA: Diagnosis not present

## 2021-03-13 DIAGNOSIS — Z3A19 19 weeks gestation of pregnancy: Secondary | ICD-10-CM | POA: Diagnosis not present

## 2021-03-19 ENCOUNTER — Other Ambulatory Visit (HOSPITAL_COMMUNITY): Payer: Self-pay

## 2021-03-19 MED ORDER — LABETALOL HCL 300 MG PO TABS
300.0000 mg | ORAL_TABLET | Freq: Two times a day (BID) | ORAL | 3 refills | Status: DC
  Filled 2021-03-19: qty 60, 30d supply, fill #0
  Filled 2021-04-18: qty 60, 30d supply, fill #1
  Filled 2021-05-20: qty 6, 3d supply, fill #2
  Filled 2021-05-21: qty 54, 27d supply, fill #2
  Filled 2021-06-17: qty 60, 30d supply, fill #3

## 2021-03-20 ENCOUNTER — Other Ambulatory Visit (HOSPITAL_COMMUNITY): Payer: Self-pay

## 2021-03-20 MED ORDER — LABETALOL HCL 300 MG PO TABS
300.0000 mg | ORAL_TABLET | Freq: Two times a day (BID) | ORAL | 1 refills | Status: DC
  Filled 2021-03-20: qty 60, 30d supply, fill #0

## 2021-03-24 DIAGNOSIS — O10112 Pre-existing hypertensive heart disease complicating pregnancy, second trimester: Secondary | ICD-10-CM | POA: Diagnosis not present

## 2021-03-24 DIAGNOSIS — Z3A21 21 weeks gestation of pregnancy: Secondary | ICD-10-CM | POA: Diagnosis not present

## 2021-03-24 DIAGNOSIS — O10012 Pre-existing essential hypertension complicating pregnancy, second trimester: Secondary | ICD-10-CM | POA: Diagnosis not present

## 2021-03-24 DIAGNOSIS — Z362 Encounter for other antenatal screening follow-up: Secondary | ICD-10-CM | POA: Diagnosis not present

## 2021-03-24 DIAGNOSIS — N898 Other specified noninflammatory disorders of vagina: Secondary | ICD-10-CM | POA: Diagnosis not present

## 2021-03-26 ENCOUNTER — Other Ambulatory Visit (HOSPITAL_COMMUNITY): Payer: Self-pay

## 2021-04-09 ENCOUNTER — Other Ambulatory Visit (HOSPITAL_COMMUNITY): Payer: Self-pay

## 2021-04-09 MED ORDER — VITAFOL ULTRA 29-0.6-0.4-200 MG PO CAPS
ORAL_CAPSULE | ORAL | 3 refills | Status: DC
  Filled 2021-04-09: qty 90, 90d supply, fill #0
  Filled 2021-04-10: qty 30, 30d supply, fill #0
  Filled 2021-05-24: qty 30, 30d supply, fill #1
  Filled 2021-07-02: qty 30, 30d supply, fill #2
  Filled 2021-08-04: qty 30, 30d supply, fill #3
  Filled 2021-09-13: qty 30, 30d supply, fill #4
  Filled 2021-11-05 – 2021-11-07 (×3): qty 30, 30d supply, fill #5

## 2021-04-10 ENCOUNTER — Other Ambulatory Visit (HOSPITAL_COMMUNITY): Payer: Self-pay

## 2021-04-13 ENCOUNTER — Other Ambulatory Visit (HOSPITAL_COMMUNITY): Payer: Self-pay

## 2021-04-20 ENCOUNTER — Other Ambulatory Visit (HOSPITAL_COMMUNITY): Payer: Self-pay

## 2021-04-22 ENCOUNTER — Other Ambulatory Visit (HOSPITAL_COMMUNITY): Payer: Self-pay

## 2021-04-27 DIAGNOSIS — O10112 Pre-existing hypertensive heart disease complicating pregnancy, second trimester: Secondary | ICD-10-CM | POA: Diagnosis not present

## 2021-04-27 DIAGNOSIS — Z3689 Encounter for other specified antenatal screening: Secondary | ICD-10-CM | POA: Diagnosis not present

## 2021-04-27 DIAGNOSIS — Z3A26 26 weeks gestation of pregnancy: Secondary | ICD-10-CM | POA: Diagnosis not present

## 2021-04-29 ENCOUNTER — Other Ambulatory Visit (HOSPITAL_COMMUNITY): Payer: Self-pay

## 2021-04-29 ENCOUNTER — Other Ambulatory Visit: Payer: Self-pay | Admitting: Obstetrics

## 2021-04-29 DIAGNOSIS — O219 Vomiting of pregnancy, unspecified: Secondary | ICD-10-CM

## 2021-04-29 MED ORDER — PROMETHAZINE HCL 25 MG RE SUPP
25.0000 mg | Freq: Four times a day (QID) | RECTAL | 2 refills | Status: DC | PRN
  Filled 2021-04-29: qty 12, 3d supply, fill #0

## 2021-05-11 DIAGNOSIS — Z3A28 28 weeks gestation of pregnancy: Secondary | ICD-10-CM | POA: Diagnosis not present

## 2021-05-11 DIAGNOSIS — Z3689 Encounter for other specified antenatal screening: Secondary | ICD-10-CM | POA: Diagnosis not present

## 2021-05-11 DIAGNOSIS — O10113 Pre-existing hypertensive heart disease complicating pregnancy, third trimester: Secondary | ICD-10-CM | POA: Diagnosis not present

## 2021-05-11 DIAGNOSIS — O10112 Pre-existing hypertensive heart disease complicating pregnancy, second trimester: Secondary | ICD-10-CM | POA: Diagnosis not present

## 2021-05-11 LAB — OB RESULTS CONSOLE RPR: RPR: NONREACTIVE

## 2021-05-21 ENCOUNTER — Other Ambulatory Visit (HOSPITAL_COMMUNITY): Payer: Self-pay

## 2021-05-21 MED ORDER — NIFEDIPINE ER OSMOTIC RELEASE 30 MG PO TB24
ORAL_TABLET | ORAL | 0 refills | Status: DC
  Filled 2021-05-21: qty 30, 30d supply, fill #0

## 2021-05-22 ENCOUNTER — Other Ambulatory Visit (HOSPITAL_COMMUNITY): Payer: Self-pay

## 2021-05-26 ENCOUNTER — Other Ambulatory Visit (HOSPITAL_COMMUNITY): Payer: Self-pay

## 2021-05-26 DIAGNOSIS — N898 Other specified noninflammatory disorders of vagina: Secondary | ICD-10-CM | POA: Diagnosis not present

## 2021-05-26 DIAGNOSIS — R399 Unspecified symptoms and signs involving the genitourinary system: Secondary | ICD-10-CM | POA: Diagnosis not present

## 2021-05-26 DIAGNOSIS — O10113 Pre-existing hypertensive heart disease complicating pregnancy, third trimester: Secondary | ICD-10-CM | POA: Diagnosis not present

## 2021-05-26 DIAGNOSIS — Z3A30 30 weeks gestation of pregnancy: Secondary | ICD-10-CM | POA: Diagnosis not present

## 2021-05-26 MED ORDER — METRONIDAZOLE 500 MG PO TABS
ORAL_TABLET | ORAL | 0 refills | Status: DC
  Filled 2021-05-26: qty 14, 7d supply, fill #0

## 2021-05-28 ENCOUNTER — Other Ambulatory Visit (HOSPITAL_COMMUNITY): Payer: Self-pay

## 2021-05-28 ENCOUNTER — Other Ambulatory Visit: Payer: Self-pay | Admitting: Obstetrics

## 2021-05-28 DIAGNOSIS — K219 Gastro-esophageal reflux disease without esophagitis: Secondary | ICD-10-CM

## 2021-05-28 MED ORDER — PANTOPRAZOLE SODIUM 40 MG PO TBEC
40.0000 mg | DELAYED_RELEASE_TABLET | Freq: Every day | ORAL | 5 refills | Status: DC
  Filled 2021-05-28: qty 30, 30d supply, fill #0
  Filled 2021-06-29: qty 30, 30d supply, fill #1

## 2021-06-09 ENCOUNTER — Other Ambulatory Visit (HOSPITAL_COMMUNITY): Payer: Self-pay

## 2021-06-09 MED ORDER — VALACYCLOVIR HCL 500 MG PO TABS
ORAL_TABLET | ORAL | 0 refills | Status: DC
  Filled 2021-06-09: qty 10, 5d supply, fill #0

## 2021-06-17 ENCOUNTER — Other Ambulatory Visit (HOSPITAL_COMMUNITY): Payer: Self-pay

## 2021-06-17 MED ORDER — NIFEDIPINE ER OSMOTIC RELEASE 30 MG PO TB24
30.0000 mg | ORAL_TABLET | Freq: Every day | ORAL | 0 refills | Status: DC
  Filled 2021-06-17: qty 30, 30d supply, fill #0

## 2021-06-18 ENCOUNTER — Other Ambulatory Visit (HOSPITAL_COMMUNITY): Payer: Self-pay

## 2021-06-18 DIAGNOSIS — O98313 Other infections with a predominantly sexual mode of transmission complicating pregnancy, third trimester: Secondary | ICD-10-CM | POA: Diagnosis not present

## 2021-06-18 DIAGNOSIS — Z3A33 33 weeks gestation of pregnancy: Secondary | ICD-10-CM | POA: Diagnosis not present

## 2021-06-18 DIAGNOSIS — O10113 Pre-existing hypertensive heart disease complicating pregnancy, third trimester: Secondary | ICD-10-CM | POA: Diagnosis not present

## 2021-06-24 DIAGNOSIS — O10113 Pre-existing hypertensive heart disease complicating pregnancy, third trimester: Secondary | ICD-10-CM | POA: Diagnosis not present

## 2021-06-24 DIAGNOSIS — Z3A34 34 weeks gestation of pregnancy: Secondary | ICD-10-CM | POA: Diagnosis not present

## 2021-06-29 ENCOUNTER — Other Ambulatory Visit (HOSPITAL_COMMUNITY): Payer: Self-pay

## 2021-06-30 ENCOUNTER — Other Ambulatory Visit (HOSPITAL_COMMUNITY): Payer: Self-pay

## 2021-06-30 DIAGNOSIS — O10113 Pre-existing hypertensive heart disease complicating pregnancy, third trimester: Secondary | ICD-10-CM | POA: Diagnosis not present

## 2021-06-30 DIAGNOSIS — N898 Other specified noninflammatory disorders of vagina: Secondary | ICD-10-CM | POA: Diagnosis not present

## 2021-06-30 DIAGNOSIS — A609 Anogenital herpesviral infection, unspecified: Secondary | ICD-10-CM | POA: Diagnosis not present

## 2021-06-30 DIAGNOSIS — Z3685 Encounter for antenatal screening for Streptococcus B: Secondary | ICD-10-CM | POA: Diagnosis not present

## 2021-06-30 DIAGNOSIS — Z3A35 35 weeks gestation of pregnancy: Secondary | ICD-10-CM | POA: Diagnosis not present

## 2021-06-30 LAB — OB RESULTS CONSOLE GBS: GBS: POSITIVE

## 2021-06-30 MED ORDER — VALACYCLOVIR HCL 500 MG PO TABS
ORAL_TABLET | ORAL | 0 refills | Status: DC
  Filled 2021-06-30: qty 60, 30d supply, fill #0

## 2021-06-30 MED ORDER — METRONIDAZOLE 0.75 % VA GEL
VAGINAL | 0 refills | Status: DC
  Filled 2021-06-30: qty 70, 5d supply, fill #0

## 2021-07-01 ENCOUNTER — Other Ambulatory Visit (HOSPITAL_COMMUNITY): Payer: Self-pay

## 2021-07-01 MED ORDER — METRONIDAZOLE 500 MG PO TABS
ORAL_TABLET | ORAL | 0 refills | Status: DC
  Filled 2021-07-01: qty 14, 7d supply, fill #0

## 2021-07-02 ENCOUNTER — Other Ambulatory Visit (HOSPITAL_COMMUNITY): Payer: Self-pay

## 2021-07-02 DIAGNOSIS — Z3482 Encounter for supervision of other normal pregnancy, second trimester: Secondary | ICD-10-CM | POA: Diagnosis not present

## 2021-07-02 DIAGNOSIS — Z3483 Encounter for supervision of other normal pregnancy, third trimester: Secondary | ICD-10-CM | POA: Diagnosis not present

## 2021-07-03 ENCOUNTER — Other Ambulatory Visit (HOSPITAL_COMMUNITY): Payer: Self-pay

## 2021-07-06 DIAGNOSIS — R609 Edema, unspecified: Secondary | ICD-10-CM | POA: Diagnosis not present

## 2021-07-08 DIAGNOSIS — O10113 Pre-existing hypertensive heart disease complicating pregnancy, third trimester: Secondary | ICD-10-CM | POA: Diagnosis not present

## 2021-07-08 DIAGNOSIS — Z3A36 36 weeks gestation of pregnancy: Secondary | ICD-10-CM | POA: Diagnosis not present

## 2021-07-08 DIAGNOSIS — A609 Anogenital herpesviral infection, unspecified: Secondary | ICD-10-CM | POA: Diagnosis not present

## 2021-07-08 DIAGNOSIS — Z23 Encounter for immunization: Secondary | ICD-10-CM | POA: Diagnosis not present

## 2021-07-17 DIAGNOSIS — Z3A37 37 weeks gestation of pregnancy: Secondary | ICD-10-CM | POA: Diagnosis not present

## 2021-07-17 DIAGNOSIS — O10113 Pre-existing hypertensive heart disease complicating pregnancy, third trimester: Secondary | ICD-10-CM | POA: Diagnosis not present

## 2021-07-19 ENCOUNTER — Other Ambulatory Visit (HOSPITAL_COMMUNITY): Payer: Self-pay

## 2021-07-20 ENCOUNTER — Other Ambulatory Visit (HOSPITAL_COMMUNITY): Payer: Self-pay

## 2021-07-20 MED ORDER — NIFEDIPINE ER OSMOTIC RELEASE 30 MG PO TB24
30.0000 mg | ORAL_TABLET | Freq: Every day | ORAL | 0 refills | Status: DC
  Filled 2021-07-20: qty 30, 30d supply, fill #0

## 2021-07-20 MED ORDER — LABETALOL HCL 300 MG PO TABS
300.0000 mg | ORAL_TABLET | Freq: Two times a day (BID) | ORAL | 0 refills | Status: DC
  Filled 2021-07-20: qty 8, 4d supply, fill #0
  Filled 2021-07-21: qty 52, 26d supply, fill #0

## 2021-07-21 ENCOUNTER — Other Ambulatory Visit (HOSPITAL_COMMUNITY): Payer: Self-pay

## 2021-07-22 ENCOUNTER — Encounter (HOSPITAL_COMMUNITY): Payer: Self-pay | Admitting: Obstetrics

## 2021-07-22 ENCOUNTER — Other Ambulatory Visit (HOSPITAL_COMMUNITY): Payer: Self-pay

## 2021-07-22 ENCOUNTER — Other Ambulatory Visit: Payer: Self-pay | Admitting: Obstetrics and Gynecology

## 2021-07-23 DIAGNOSIS — Z3A38 38 weeks gestation of pregnancy: Secondary | ICD-10-CM | POA: Diagnosis not present

## 2021-07-23 DIAGNOSIS — O10113 Pre-existing hypertensive heart disease complicating pregnancy, third trimester: Secondary | ICD-10-CM | POA: Diagnosis not present

## 2021-07-24 ENCOUNTER — Inpatient Hospital Stay (HOSPITAL_COMMUNITY): Payer: 59 | Attending: Obstetrics and Gynecology

## 2021-07-24 ENCOUNTER — Other Ambulatory Visit: Payer: Self-pay

## 2021-07-24 ENCOUNTER — Inpatient Hospital Stay (HOSPITAL_COMMUNITY): Payer: 59 | Admitting: Anesthesiology

## 2021-07-24 ENCOUNTER — Encounter (HOSPITAL_COMMUNITY): Payer: Self-pay | Admitting: Obstetrics & Gynecology

## 2021-07-24 ENCOUNTER — Inpatient Hospital Stay (HOSPITAL_COMMUNITY): Admission: AD | Admit: 2021-07-24 | Payer: 59 | Source: Home / Self Care | Admitting: Obstetrics and Gynecology

## 2021-07-24 ENCOUNTER — Inpatient Hospital Stay (HOSPITAL_COMMUNITY)
Admission: AD | Admit: 2021-07-24 | Discharge: 2021-07-26 | DRG: 806 | Disposition: A | Discharge status: Home / Self Care | Payer: 59 | Attending: Obstetrics and Gynecology | Admitting: Obstetrics and Gynecology

## 2021-07-24 ENCOUNTER — Inpatient Hospital Stay (HOSPITAL_COMMUNITY): Admission: AD | Admit: 2021-07-24 | Payer: 59 | Source: Home / Self Care | Admitting: Obstetrics & Gynecology

## 2021-07-24 DIAGNOSIS — O34219 Maternal care for unspecified type scar from previous cesarean delivery: Secondary | ICD-10-CM | POA: Diagnosis not present

## 2021-07-24 DIAGNOSIS — O10913 Unspecified pre-existing hypertension complicating pregnancy, third trimester: Secondary | ICD-10-CM

## 2021-07-24 DIAGNOSIS — O1002 Pre-existing essential hypertension complicating childbirth: Secondary | ICD-10-CM | POA: Diagnosis not present

## 2021-07-24 DIAGNOSIS — I10 Essential (primary) hypertension: Secondary | ICD-10-CM | POA: Diagnosis present

## 2021-07-24 DIAGNOSIS — O1012 Pre-existing hypertensive heart disease complicating childbirth: Secondary | ICD-10-CM | POA: Diagnosis not present

## 2021-07-24 DIAGNOSIS — Z3A38 38 weeks gestation of pregnancy: Secondary | ICD-10-CM | POA: Diagnosis not present

## 2021-07-24 DIAGNOSIS — O365930 Maternal care for other known or suspected poor fetal growth, third trimester, not applicable or unspecified: Principal | ICD-10-CM | POA: Diagnosis present

## 2021-07-24 DIAGNOSIS — O10919 Unspecified pre-existing hypertension complicating pregnancy, unspecified trimester: Secondary | ICD-10-CM

## 2021-07-24 DIAGNOSIS — O34211 Maternal care for low transverse scar from previous cesarean delivery: Secondary | ICD-10-CM | POA: Diagnosis not present

## 2021-07-24 DIAGNOSIS — O479 False labor, unspecified: Secondary | ICD-10-CM

## 2021-07-24 DIAGNOSIS — Z20822 Contact with and (suspected) exposure to covid-19: Secondary | ICD-10-CM | POA: Diagnosis present

## 2021-07-24 DIAGNOSIS — O164 Unspecified maternal hypertension, complicating childbirth: Secondary | ICD-10-CM | POA: Diagnosis not present

## 2021-07-24 DIAGNOSIS — O99824 Streptococcus B carrier state complicating childbirth: Secondary | ICD-10-CM | POA: Diagnosis not present

## 2021-07-24 DIAGNOSIS — Z3490 Encounter for supervision of normal pregnancy, unspecified, unspecified trimester: Secondary | ICD-10-CM | POA: Diagnosis present

## 2021-07-24 LAB — CBC
HCT: 39 % (ref 36.0–46.0)
HCT: 42.1 % (ref 36.0–46.0)
Hemoglobin: 12.6 g/dL (ref 12.0–15.0)
Hemoglobin: 13.7 g/dL (ref 12.0–15.0)
MCH: 26.5 pg (ref 26.0–34.0)
MCH: 26.4 pg (ref 26.0–34.0)
MCHC: 32.3 g/dL (ref 30.0–36.0)
MCHC: 32.5 g/dL (ref 30.0–36.0)
MCV: 81.9 fL (ref 80.0–100.0)
MCV: 81.1 fL (ref 80.0–100.0)
Platelets: 231 10*3/uL (ref 150–400)
Platelets: 212 10*3/uL (ref 150–400)
RBC: 4.76 MIL/uL (ref 3.87–5.11)
RBC: 5.19 MIL/uL — ABNORMAL HIGH (ref 3.87–5.11)
RDW: 14.1 % (ref 11.5–15.5)
RDW: 14.3 % (ref 11.5–15.5)
WBC: 6.6 10*3/uL (ref 4.0–10.5)
WBC: 12 10*3/uL — ABNORMAL HIGH (ref 4.0–10.5)
nRBC: 0 % (ref 0.0–0.2)
nRBC: 0 % (ref 0.0–0.2)

## 2021-07-24 LAB — COMPREHENSIVE METABOLIC PANEL
ALT: 21 U/L (ref 0–44)
ALT: 20 U/L (ref 0–44)
AST: 27 U/L (ref 15–41)
AST: 31 U/L (ref 15–41)
Albumin: 2.8 g/dL — ABNORMAL LOW (ref 3.5–5.0)
Albumin: 3 g/dL — ABNORMAL LOW (ref 3.5–5.0)
Alkaline Phosphatase: 154 U/L — ABNORMAL HIGH (ref 38–126)
Alkaline Phosphatase: 157 U/L — ABNORMAL HIGH (ref 38–126)
Anion gap: 9 (ref 5–15)
Anion gap: 8 (ref 5–15)
BUN: <5 mg/dL — ABNORMAL LOW (ref 6–20)
BUN: 5 mg/dL — ABNORMAL LOW (ref 6–20)
CO2: 22 mmol/L (ref 22–32)
CO2: 22 mmol/L (ref 22–32)
Calcium: 8.9 mg/dL (ref 8.9–10.3)
Calcium: 8.7 mg/dL — ABNORMAL LOW (ref 8.9–10.3)
Chloride: 105 mmol/L (ref 98–111)
Chloride: 103 mmol/L (ref 98–111)
Creatinine, Ser: 0.66 mg/dL (ref 0.44–1.00)
Creatinine, Ser: 0.72 mg/dL (ref 0.44–1.00)
GFR, Estimated: >60 mL/min (ref >60)
GFR, Estimated: >60 mL/min (ref >60)
Glucose, Bld: 92 mg/dL (ref 70–99)
Glucose, Bld: 90 mg/dL (ref 70–99)
Potassium: 4.1 mmol/L (ref 3.5–5.1)
Potassium: 4.2 mmol/L (ref 3.5–5.1)
Sodium: 136 mmol/L (ref 135–145)
Sodium: 133 mmol/L — ABNORMAL LOW (ref 135–145)
Total Bilirubin: 0.4 mg/dL (ref 0.3–1.2)
Total Bilirubin: 0.5 mg/dL (ref 0.3–1.2)
Total Protein: 6 g/dL — ABNORMAL LOW (ref 6.5–8.1)
Total Protein: 6.2 g/dL — ABNORMAL LOW (ref 6.5–8.1)

## 2021-07-24 LAB — PROTEIN / CREATININE RATIO, URINE
Creatinine, Urine: 46.81 mg/dL
Protein Creatinine Ratio: 0.15 mg/mg{Cre} (ref 0.00–0.15)
Total Protein, Urine: 7 mg/dL

## 2021-07-24 LAB — RESP PANEL BY RT-PCR (FLU A&B, COVID) ARPGX2
Influenza A by PCR: NEGATIVE
Influenza B by PCR: NEGATIVE
SARS Coronavirus 2 by RT PCR: NEGATIVE

## 2021-07-24 LAB — TYPE AND SCREEN
ABO/RH(D): O POS
Antibody Screen: NEGATIVE

## 2021-07-24 LAB — RPR: RPR Ser Ql: NONREACTIVE

## 2021-07-24 MED ORDER — DIBUCAINE (PERIANAL) 1 % EX OINT: 1.0000 "application " | TOPICAL_OINTMENT | CUTANEOUS | Status: DC | PRN

## 2021-07-24 MED ORDER — SENNOSIDES-DOCUSATE SODIUM 8.6-50 MG PO TABS
2.0000 | ORAL_TABLET | Freq: Every day | ORAL | Status: DC
  Administered 2021-07-25 – 2021-07-26 (×2): 2 via ORAL
  Filled 2021-07-24 (×2): qty 2

## 2021-07-24 MED ORDER — DIPHENHYDRAMINE HCL 50 MG/ML IJ SOLN: 12.5000 mg | INTRAMUSCULAR | Status: DC | PRN

## 2021-07-24 MED ORDER — NIFEDIPINE ER OSMOTIC RELEASE 30 MG PO TB24
30.0000 mg | ORAL_TABLET | Freq: Every day | ORAL | Status: DC
  Administered 2021-07-24: 30 mg via ORAL
  Filled 2021-07-24: qty 1

## 2021-07-24 MED ORDER — BENZOCAINE-MENTHOL 20-0.5 % EX AERO
1.0000 "application " | INHALATION_SPRAY | CUTANEOUS | Status: DC | PRN
  Administered 2021-07-25: 1 via TOPICAL
  Filled 2021-07-24: qty 56

## 2021-07-24 MED ORDER — EPHEDRINE 5 MG/ML INJ
10.0000 mg | INTRAVENOUS | Status: DC | PRN
Start: 2021-07-24 — End: 2021-07-24

## 2021-07-24 MED ORDER — LABETALOL HCL 200 MG PO TABS
300.0000 mg | ORAL_TABLET | Freq: Two times a day (BID) | ORAL | Status: DC
  Administered 2021-07-24 – 2021-07-26 (×4): 300 mg via ORAL
  Filled 2021-07-24 (×5): qty 1

## 2021-07-24 MED ORDER — LACTATED RINGERS IV SOLN: INTRAVENOUS | Status: DC

## 2021-07-24 MED ORDER — HYDRALAZINE HCL 20 MG/ML IJ SOLN: 10.0000 mg | INTRAMUSCULAR | Status: DC | PRN

## 2021-07-24 MED ORDER — LABETALOL HCL 5 MG/ML IV SOLN: 80.0000 mg | INTRAVENOUS | Status: DC | PRN

## 2021-07-24 MED ORDER — PENICILLIN G POT IN DEXTROSE 60000 UNIT/ML IV SOLN
3.0000 10*6.[IU] | INTRAVENOUS | Status: DC
  Administered 2021-07-24: 3 10*6.[IU] via INTRAVENOUS
  Filled 2021-07-24: qty 50

## 2021-07-24 MED ORDER — ACETAMINOPHEN 325 MG PO TABS
650.0000 mg | ORAL_TABLET | ORAL | Status: DC | PRN
  Administered 2021-07-24: 650 mg via ORAL
  Filled 2021-07-24: qty 2

## 2021-07-24 MED ORDER — OXYCODONE-ACETAMINOPHEN 5-325 MG PO TABS: 2.0000 | ORAL_TABLET | ORAL | Status: DC | PRN

## 2021-07-24 MED ORDER — LABETALOL HCL 5 MG/ML IV SOLN
40.0000 mg | INTRAVENOUS | Status: DC | PRN
  Administered 2021-07-24: 40 mg via INTRAVENOUS
  Filled 2021-07-24: qty 8

## 2021-07-24 MED ORDER — TERBUTALINE SULFATE 1 MG/ML IJ SOLN: 0.2500 mg | Freq: Once | INTRAMUSCULAR | Status: DC | PRN

## 2021-07-24 MED ORDER — METHYLERGONOVINE MALEATE 0.2 MG/ML IJ SOLN: 0.2000 mg | INTRAMUSCULAR | Status: DC | PRN

## 2021-07-24 MED ORDER — OXYTOCIN-SODIUM CHLORIDE 30-0.9 UT/500ML-% IV SOLN
1.0000 m[IU]/min | INTRAVENOUS | Status: DC
  Administered 2021-07-24: 2 m[IU]/min via INTRAVENOUS
  Filled 2021-07-24: qty 500

## 2021-07-24 MED ORDER — OXYCODONE-ACETAMINOPHEN 5-325 MG PO TABS
1.0000 | ORAL_TABLET | ORAL | Status: DC | PRN
Start: 2021-07-24 — End: 2021-07-26
  Administered 2021-07-24 – 2021-07-26 (×4): 1 via ORAL
  Filled 2021-07-24 (×4): qty 1

## 2021-07-24 MED ORDER — PHENYLEPHRINE 40 MCG/ML (10ML) SYRINGE FOR IV PUSH (FOR BLOOD PRESSURE SUPPORT): 80.0000 ug | PREFILLED_SYRINGE | INTRAVENOUS | Status: DC | PRN

## 2021-07-24 MED ORDER — DIPHENHYDRAMINE HCL 25 MG PO CAPS: 25.0000 mg | ORAL_CAPSULE | Freq: Four times a day (QID) | ORAL | Status: DC | PRN

## 2021-07-24 MED ORDER — PRENATAL MULTIVITAMIN CH
1.0000 | ORAL_TABLET | Freq: Every day | ORAL | Status: DC
  Filled 2021-07-24 (×2): qty 1

## 2021-07-24 MED ORDER — HYDRALAZINE HCL 20 MG/ML IJ SOLN
10.0000 mg | INTRAMUSCULAR | Status: DC | PRN
  Administered 2021-07-24: 10 mg via INTRAVENOUS
  Filled 2021-07-24: qty 1

## 2021-07-24 MED ORDER — TETANUS-DIPHTH-ACELL PERTUSSIS 5-2.5-18.5 LF-MCG/0.5 IM SUSY: 0.5000 mL | PREFILLED_SYRINGE | Freq: Once | INTRAMUSCULAR | Status: DC

## 2021-07-24 MED ORDER — EPHEDRINE 5 MG/ML INJ: 10.0000 mg | INTRAVENOUS | Status: DC | PRN

## 2021-07-24 MED ORDER — SIMETHICONE 80 MG PO CHEW: 80.0000 mg | CHEWABLE_TABLET | ORAL | Status: DC | PRN

## 2021-07-24 MED ORDER — IBUPROFEN 600 MG PO TABS
600.0000 mg | ORAL_TABLET | Freq: Four times a day (QID) | ORAL | Status: DC
  Administered 2021-07-24 – 2021-07-26 (×8): 600 mg via ORAL
  Filled 2021-07-24 (×8): qty 1

## 2021-07-24 MED ORDER — LABETALOL HCL 5 MG/ML IV SOLN: 40.0000 mg | INTRAVENOUS | Status: DC | PRN

## 2021-07-24 MED ORDER — ONDANSETRON HCL 4 MG PO TABS: 4.0000 mg | ORAL_TABLET | ORAL | Status: DC | PRN

## 2021-07-24 MED ORDER — ZOLPIDEM TARTRATE 5 MG PO TABS: 5.0000 mg | ORAL_TABLET | Freq: Every evening | ORAL | Status: DC | PRN

## 2021-07-24 MED ORDER — LIDOCAINE HCL (PF) 1 % IJ SOLN
INTRAMUSCULAR | Status: DC | PRN
  Administered 2021-07-24: 10 mL via EPIDURAL

## 2021-07-24 MED ORDER — COCONUT OIL OIL: 1.0000 "application " | TOPICAL_OIL | Status: DC | PRN

## 2021-07-24 MED ORDER — LACTATED RINGERS IV SOLN: 500.0000 mL | INTRAVENOUS | Status: DC | PRN

## 2021-07-24 MED ORDER — LABETALOL HCL 5 MG/ML IV SOLN
80.0000 mg | INTRAVENOUS | Status: DC | PRN
  Administered 2021-07-24: 80 mg via INTRAVENOUS
  Filled 2021-07-24: qty 16

## 2021-07-24 MED ORDER — LABETALOL HCL 200 MG PO TABS
300.0000 mg | ORAL_TABLET | Freq: Two times a day (BID) | ORAL | Status: DC
  Administered 2021-07-24: 300 mg via ORAL
  Filled 2021-07-24: qty 1

## 2021-07-24 MED ORDER — LIDOCAINE HCL (PF) 1 % IJ SOLN: 30.0000 mL | INTRAMUSCULAR | Status: DC | PRN

## 2021-07-24 MED ORDER — LABETALOL HCL 5 MG/ML IV SOLN: 20.0000 mg | INTRAVENOUS | Status: DC | PRN

## 2021-07-24 MED ORDER — SODIUM CHLORIDE 0.9 % IV SOLN
5.0000 10*6.[IU] | Freq: Once | INTRAVENOUS | Status: AC
  Administered 2021-07-24: 5 10*6.[IU] via INTRAVENOUS
  Filled 2021-07-24: qty 5

## 2021-07-24 MED ORDER — SOD CITRATE-CITRIC ACID 500-334 MG/5ML PO SOLN: 30.0000 mL | ORAL | Status: DC | PRN

## 2021-07-24 MED ORDER — ONDANSETRON HCL 4 MG/2ML IJ SOLN: 4.0000 mg | INTRAMUSCULAR | Status: DC | PRN

## 2021-07-24 MED ORDER — METHYLERGONOVINE MALEATE 0.2 MG PO TABS: 0.2000 mg | ORAL_TABLET | ORAL | Status: DC | PRN

## 2021-07-24 MED ORDER — FENTANYL-BUPIVACAINE-NACL 0.5-0.125-0.9 MG/250ML-% EP SOLN
12.0000 mL/h | EPIDURAL | Status: DC | PRN
  Administered 2021-07-24: 12 mL/h via EPIDURAL
  Filled 2021-07-24: qty 250

## 2021-07-24 MED ORDER — LABETALOL HCL 5 MG/ML IV SOLN
20.0000 mg | INTRAVENOUS | Status: DC | PRN
  Administered 2021-07-24: 20 mg via INTRAVENOUS
  Filled 2021-07-24 (×2): qty 4

## 2021-07-24 MED ORDER — NIFEDIPINE ER OSMOTIC RELEASE 30 MG PO TB24
30.0000 mg | ORAL_TABLET | Freq: Every day | ORAL | Status: DC
  Administered 2021-07-25: 30 mg via ORAL
  Filled 2021-07-24: qty 1

## 2021-07-24 MED ORDER — WITCH HAZEL-GLYCERIN EX PADS: 1.0000 "application " | MEDICATED_PAD | CUTANEOUS | Status: DC | PRN

## 2021-07-24 MED ORDER — ONDANSETRON HCL 4 MG/2ML IJ SOLN: 4.0000 mg | Freq: Four times a day (QID) | INTRAMUSCULAR | Status: DC | PRN

## 2021-07-24 MED ORDER — LACTATED RINGERS IV SOLN: 500.0000 mL | Freq: Once | INTRAVENOUS | Status: DC

## 2021-07-24 MED ORDER — ACETAMINOPHEN 325 MG PO TABS: 650.0000 mg | ORAL_TABLET | ORAL | Status: DC | PRN

## 2021-07-24 MED ORDER — OXYTOCIN BOLUS FROM INFUSION
333.0000 mL | Freq: Once | INTRAVENOUS | Status: AC
  Administered 2021-07-24: 333 mL via INTRAVENOUS

## 2021-07-24 MED ORDER — OXYTOCIN-SODIUM CHLORIDE 30-0.9 UT/500ML-% IV SOLN
2.5000 [IU]/h | INTRAVENOUS | Status: DC
  Administered 2021-07-24: 2.5 [IU]/h via INTRAVENOUS

## 2021-07-24 NOTE — MAU Note (Signed)
Received 35 y/o, [id number] , reported ctx pain since 10pm, 8/10 scale, pt denies any vaginal bleeding or leakage of fluid, + fetal movement, safety maintained.

## 2021-07-24 NOTE — H&P (Signed)
Chelsea Middleton is a 35 y.o. female presenting for early labor History of HSV without lesions. OB History     Gravida  4   Para  1   Term  1   Preterm      AB  2   Living  1      SAB      IAB  2   Ectopic      Multiple      Live Births  1          Past Medical History:  Diagnosis Date   Asthma    HSV (herpes simplex virus) infection    Hypertension    Pemphigus foliaceous    Past Surgical History:  Procedure Laterality Date   HERNIA REPAIR     TONSILLECTOMY     Family History: family history includes Hypertension in her mother. Social History:  reports that she has never smoked. She has never used smokeless tobacco. She reports that she does not currently use alcohol. She reports that she does not use drugs.     Maternal Diabetes: No Genetic Screening: Normal Maternal Ultrasounds/Referrals: IUGR Fetal Ultrasounds or other Referrals:  None Maternal Substance Abuse:  No Significant Maternal Medications:  Meds include: Other: Labetalol and procardia Significant Maternal Lab Results:  Group B Strep positive Other Comments:  None  Review of Systems  Constitutional: Negative.   All other systems reviewed and are negative. Maternal Medical History:  Reason for admission: Contractions.   Contractions: Onset was 3-5 hours ago.   Frequency: irregular.   Perceived severity is moderate.   Fetal activity: Perceived fetal activity is normal.   Last perceived fetal movement was within the past hour.   Prenatal complications: PIH and IUGR.   Prenatal Complications - Diabetes: none.  Dilation: 1.5 Effacement (%): 50 Station: -3 Exam by:: Hurshel Keys Blood pressure 113/88, pulse 96, temperature 97.8 F (36.6 C), temperature source Oral, resp. rate 18, height 5\' 2"  (1.575 m), weight 75.5 kg, SpO2 100 %. Maternal Exam:  Uterine Assessment: Contraction strength is moderate.  Contraction frequency is irregular.  Abdomen: Patient reports no abdominal  tenderness. Surgical scars: low transverse.   Fetal presentation: vertex Introitus: Normal vulva. Normal vagina.  Ferning test: not done.  Nitrazine test: not done. Amniotic fluid character: not assessed. Pelvis: adequate for delivery.   Cervix: Cervix evaluated by digital exam.    Physical Exam Constitutional:      Appearance: Normal appearance. She is normal weight.  HENT:     Head: Normocephalic and atraumatic.  Cardiovascular:     Rate and Rhythm: Normal rate and regular rhythm.     Pulses: Normal pulses.     Heart sounds: Normal heart sounds.  Pulmonary:     Effort: Pulmonary effort is normal.     Breath sounds: Normal breath sounds.  Abdominal:     General: Abdomen is flat. Bowel sounds are normal.     Palpations: Abdomen is soft.  Genitourinary:    General: Normal vulva.  Musculoskeletal:        General: Normal range of motion.     Cervical back: Normal range of motion and neck supple.  Skin:    General: Skin is warm and dry.  Neurological:     General: No focal deficit present.     Mental Status: She is alert and oriented to person, place, and time.  Psychiatric:        Mood and Affect: Mood normal.  Behavior: Behavior normal.    Prenatal labs: ABO, Rh: --/--/O POS (10/21 0355) Antibody: NEG (10/21 0355) Rubella:  imm RPR: Nonreactive (08/08 0000)  HBsAg:   neg HIV:   neg GBS: Positive/-- (09/27 0000)   Assessment/Plan: Term IUP IUGR Early labor CHTN with exacerbation, nl labs HSV- no lesion TOLAC Admit  Rohaan Durnil J 07/24/2021, 6:30 AM

## 2021-07-24 NOTE — MAU Provider Note (Signed)
Chief Complaint:  Contractions   None    HPI  HPI: Chelsea Middleton is a 35 y.o. [phone] at 70w6dwho presents to maternity admissions reporting painful contractions  She is set to be induced in the morning.  Has chronic hypertension and had Laminaria inserted in office for ripening.  Hx previous C/S, planning TOLAC. She reports good fetal movement, denies LOF, vaginal bleeding, vaginal itching/burning, urinary symptoms, h/a, dizziness, n/v, diarrhea, constipation or fever/chills.  She denies headache, visual changes or RUQ abdominal pain.   Past Medical History: Past Medical History:  Diagnosis Date   Asthma    HSV (herpes simplex virus) infection    Hypertension    Pemphigus foliaceous     Past obstetric history: OB History  Gravida Para Term Preterm AB Living  4 1 1   2 1   SAB IAB Ectopic Multiple Live Births    2     1    # Outcome Date GA Lbr Len/2nd Weight Sex Delivery Anes PTL Lv  4 Current           3 Term 2016     CS-Unspec   LIV  2 IAB 2015          1 IAB 2007            Past Surgical History: Past Surgical History:  Procedure Laterality Date   HERNIA REPAIR     TONSILLECTOMY      Family History: Family History  Problem Relation Age of Onset   Hypertension Mother     Social History: Social History   Tobacco Use   Smoking status: Never   Smokeless tobacco: Never  Vaping Use   Vaping Use: Never used  Substance Use Topics   Alcohol use: Not Currently   Drug use: No    Allergies: No Known Allergies  Meds:  Medications Prior to Admission  Medication Sig Dispense Refill Last Dose   labetalol (NORMODYNE) 300 MG tablet Take 1 tablet by mouth twice a day 60 tablet 0 07/23/2021   NIFEdipine (PROCARDIA-XL/NIFEDICAL-XL) 30 MG 24 hr tablet Take 1 tablet by mouth daily 30 tablet 0 07/23/2021   pantoprazole (PROTONIX) 40 MG tablet Take 1 tablet (40 mg total) by mouth daily. 30 tablet 5 07/23/2021   Prenat-Fe Poly-Methfol-FA-DHA (VITAFOL ULTRA)  29-0.6-0.4-200 MG CAPS Take 1 capsule by mouth daily 90 capsule 3 07/23/2021   valACYclovir (VALTREX) 500 MG tablet Take 1 tablet by mouth every 12 hours for 5 days as directed 60 tablet 0 07/23/2021   B Complex-C-E-Zn (B COMPLEX-C-E-ZINC) tablet Take 1 tablet by mouth daily. 30 tablet 11 Unknown   clobetasol cream (TEMOVATE) 3.61 % Apply 1 application topically 2 (two) times daily as needed (rash).   Unknown   labetalol (NORMODYNE) 200 MG tablet Take 1 tablet (200 mg total) by mouth 2 (two) times daily. 60 tablet 11    labetalol (NORMODYNE) 300 MG tablet Take 1 tablet by mouth twice a day 60 tablet 1    metroNIDAZOLE (FLAGYL) 500 MG tablet Take 1 tablet by mouth 2 times a day for 7 days 14 tablet 0 Unknown   Omega-3 Fatty Acids (FISH OIL PO) Take 1 capsule by mouth 2 (two) times daily.   Unknown   promethazine (PHENERGAN) 25 MG suppository Unwrap and insert 1 suppository (25 mg total) rectally every 6 (six) hours as needed for nausea or vomiting. 12 each 2 Unknown    I have reviewed patient's Past Medical Hx, Surgical Hx, Family Hx, Social  Hx, medications and allergies.   ROS:  Review of Systems  Constitutional:  Negative for chills and fever.  Respiratory:  Negative for shortness of breath.   Cardiovascular:  Negative for leg swelling.  Gastrointestinal:  Negative for constipation, diarrhea, nausea and vomiting.  Genitourinary:  Positive for pelvic pain. Negative for vaginal bleeding.  Musculoskeletal:  Positive for back pain.  Neurological:  Negative for weakness and headaches.  Other systems negative  Physical Exam  Patient Vitals for the past 24 hrs:  BP Temp Temp src Pulse Resp Height Weight  07/24/21 0331 (!) 149/97 -- -- 89 -- -- --  07/24/21 0326 (!) 155/111 -- -- 79 -- -- --  07/24/21 0316 (!) 163/110 -- -- 81 -- -- --  07/24/21 0302 (!) 200/118 -- -- 67 -- -- --  07/24/21 0251 (!) 154/106 -- -- 96 -- -- --  07/24/21 0234 (!) 144/106 -- -- 100 -- -- --  07/24/21 0231 (!)  176/124 -- -- 96 -- -- --  07/24/21 0228 (!) 152/114 98.1 F (36.7 C) Oral 87 18 -- --  07/24/21 0214 (!) 148/108 98.2 F (36.8 C) Oral -- 20 5\' 2"  (1.575 m) 75.5 kg   Constitutional: Well-developed, well-nourished female in no acute distress.  Cardiovascular: normal rate and rhythm Respiratory: normal effort, clear to auscultation bilaterally GI: Abd soft, non-tender, gravid appropriate for gestational age.   No rebound or guarding. MS: Extremities nontender, no edema, normal ROM Neurologic: Alert and oriented x 4.  GU: Neg CVAT.  PELVIC EXAM:  Laminaria in cervix Dilation: 1.5 Effacement (%): 50 Cervical Position: Middle Station: -3 Presentation: Vertex Exam by:: Lelan Pons CNM  FHT:  Baseline 140 , moderate variability, accelerations present, no decelerations Contractions: q 2-3 mins Irregular     Labs: Pending O/Positive/-- (02/24 0000)  Imaging:  No results found.  MAU Course/MDM: I have ordered labs and removed Laminaria per Dr Ronita Hipps request NST reviewed  Treatments in MAU included EFM.    Assessment: [place] IUP at [redacted]w[redacted]d Chronic hypertension with exacerbation to severe range BPs Labor TOLAC  Plan: Admit for Labor Preeclampsia focused order set Laminaria removed MD to follow  Hansel Feinstein CNM, MSN Certified Nurse-Midwife 07/24/2021 3:35 AM

## 2021-07-24 NOTE — Anesthesia Procedure Notes (Signed)
Epidural Patient location during procedure: OB Start time: 07/24/2021 5:42 AM End time: 07/24/2021 5:53 AM  Staffing Anesthesiologist: Lidia Collum, MD Performed: anesthesiologist   Preanesthetic Checklist Completed: patient identified, IV checked, risks and benefits discussed, monitors and equipment checked, pre-op evaluation and timeout performed  Epidural Patient position: sitting Prep: DuraPrep Patient monitoring: heart rate, continuous pulse ox and blood pressure Approach: midline Location: L3-L4 Injection technique: LOR air  Needle:  Needle type: Tuohy  Needle gauge: 17 G Needle length: 9 cm Needle insertion depth: 6 cm Catheter type: closed end flexible Catheter size: 19 Gauge Catheter at skin depth: 11 cm Test dose: negative  Assessment Events: blood not aspirated, injection not painful, no injection resistance, no paresthesia and negative IV test  Additional Notes Reason for block:procedure for pain

## 2021-07-24 NOTE — MAU Note (Signed)
PT SAYS [facility] WAS IN OFFICE TODAY- DR Ronita Hipps- INSERTED SEAWEED INTO CERVIX- CLOSED

## 2021-07-24 NOTE — Lactation Note (Signed)
This note was copied from a baby's chart. Lactation Consultation Note  Patient Name: Chelsea Middleton Today's Date: 07/24/2021 Age:35 hours  LC contacted Berniece Andreas. Mother declines [place] visit in L&D and prefers to be seen once in [place].    Jkwon Treptow A Higuera Ancidey 07/24/2021, 12:48 PM

## 2021-07-24 NOTE — Progress Notes (Signed)
Chelsea Middleton is a 35 y.o. G4P1021 at [redacted]w[redacted]d by LMP admitted for active labor  Subjective: Comfortable with epidural No headache, epigastric pain or vision changes noted  Objective: BP (!) 144/94   Pulse 81   Temp 97.8 F (36.6 C) (Oral)   Resp 16   Ht 5\' 2"  (1.575 m)   Wt 75.5 kg   SpO2 100%   BMI 30.43 kg/m  No intake/output data recorded. No intake/output data recorded.  FHT:  FHR: 125 bpm, variability: moderate,  accelerations:  Present,  decelerations:  Absent UC:   irregular, every 3-5 minutes SVE:   3-4/60/-1 AROM clear IUPC placed without difficulty  Labs: Lab Results  Component Value Date   WBC 6.6 07/24/2021   HGB 12.6 07/24/2021   HCT 39.0 07/24/2021   MCV 81.9 07/24/2021   PLT 231 07/24/2021    Assessment / Plan: Spontaneous labor, progressing normally TOLAC CHTN with exacerbation, nl labs IUGR  Labor: Progressing normally- may need augmentation Preeclampsia:  no signs or symptoms of toxicity, intake and ouput balanced, and labs stable Fetal Wellbeing:  Category I Pain Control:  Epidural I/D:  n/a Anticipated MOD:   guarded  Chelsea Middleton 07/24/2021, 7:56 AM

## 2021-07-24 NOTE — Anesthesia Preprocedure Evaluation (Signed)
Anesthesia Evaluation  Patient identified by MRN, date of birth, ID band Patient awake    Reviewed: Allergy & Precautions, H&P , NPO status , Patient's Chart, lab work & pertinent test results  History of Anesthesia Complications Negative for: history of anesthetic complications  Airway Mallampati: II  TM Distance: >3 FB     Dental   Pulmonary asthma ,    Pulmonary exam normal        Cardiovascular hypertension,  Rhythm:regular Rate:Normal     Neuro/Psych negative neurological ROS  negative psych ROS   GI/Hepatic negative GI ROS, Neg liver ROS,   Endo/Other  negative endocrine ROS  Renal/GU negative Renal ROS  negative genitourinary   Musculoskeletal   Abdominal   Peds  Hematology negative hematology ROS (+)   Anesthesia Other Findings   Reproductive/Obstetrics (+) Pregnancy                             Anesthesia Physical Anesthesia Plan  ASA: 2  Anesthesia Plan: Epidural   Post-op Pain Management:    Induction:   PONV Risk Score and Plan:   Airway Management Planned:   Additional Equipment:   Intra-op Plan:   Post-operative Plan:   Informed Consent: I have reviewed the patients History and Physical, chart, labs and discussed the procedure including the risks, benefits and alternatives for the proposed anesthesia with the patient or authorized representative who has indicated his/her understanding and acceptance.       Plan Discussed with:   Anesthesia Plan Comments:         Anesthesia Quick Evaluation

## 2021-07-24 NOTE — Anesthesia Postprocedure Evaluation (Signed)
Anesthesia Post Note  Patient: Chelsea Middleton  Procedure(s) Performed: AN AD HOC LABOR EPIDURAL     Patient location during evaluation: Mother Baby Anesthesia Type: Epidural Level of consciousness: awake, awake and alert, patient cooperative and oriented Pain management: pain level controlled Vital Signs Assessment: post-procedure vital signs reviewed and stable Respiratory status: spontaneous breathing Cardiovascular status: blood pressure returned to baseline Postop Assessment: no headache, no backache, no apparent nausea or vomiting, patient able to bend at knees, able to ambulate and adequate PO intake Anesthetic complications: no   No notable events documented.  Last Vitals:  Vitals:   07/24/21 1530 07/24/21 1638  BP: (!) 136/95 (!) 136/93  Pulse: (!) 108 94  Resp: 18 18  Temp: 37.1 C (!) 36.4 C  SpO2: 100%     Last Pain:  Vitals:   07/24/21 1744  TempSrc:   PainSc: 0-No pain   Pain Goal:                   Sammie Bench

## 2021-07-25 DIAGNOSIS — I10 Essential (primary) hypertension: Secondary | ICD-10-CM | POA: Diagnosis present

## 2021-07-25 LAB — CBC
HCT: 31.7 % — ABNORMAL LOW (ref 36.0–46.0)
Hemoglobin: 10.6 g/dL — ABNORMAL LOW (ref 12.0–15.0)
MCH: 27.1 pg (ref 26.0–34.0)
MCHC: 33.4 g/dL (ref 30.0–36.0)
MCV: 81.1 fL (ref 80.0–100.0)
Platelets: 176 10*3/uL (ref 150–400)
RBC: 3.91 MIL/uL (ref 3.87–5.11)
RDW: 14.4 % (ref 11.5–15.5)
WBC: 11 10*3/uL — ABNORMAL HIGH (ref 4.0–10.5)
nRBC: 0 % (ref 0.0–0.2)

## 2021-07-25 MED ORDER — NIFEDIPINE ER OSMOTIC RELEASE 30 MG PO TB24
30.0000 mg | ORAL_TABLET | Freq: Two times a day (BID) | ORAL | Status: DC
  Administered 2021-07-25: 30 mg via ORAL
  Filled 2021-07-25: qty 1

## 2021-07-25 NOTE — Lactation Note (Signed)
This note was copied from a baby's chart. Lactation Consultation Note  Patient Name: Girl Yomira Flitton Today's Date: 07/25/2021 Reason for consult: Initial assessment;Infant < 6lbs;Early term 37-38.6wks;Other (Comment) (SGA) Age:35 years  LC in to room for initial visit. Mother states baby has been feeding well and seems to be more eager/active at breast. Mother latches independently, observed good posture, alignment, breast support. Noted suckling and audible swallowing. Discussed potential need for supplementation to support baby's energy and explained alternatives available.   Offered to set up DEBP but mother declines. Mother explains she will start using her personal pump [facility]) instead. Reinforced frequency/use/care and milk storage. Mother also declines demonstration of hand expression.   Reviewed newborn behavior, feeding patterns and expectations with family.   Plan:   1-Breastfeeding on demand, ensuring a deep, comfortable latch.  2-Preserve infant energy limiting feeding sessions to 30 min max.  3-Pump for supplementation purposes  4-Encouraged maternal hydration, nutrition and rest.  Contact LC as needed for feeds/support/concerns/questions. All questions answered at this time. [place] brochure and [employer] booklet reviewed.  Maternal Data Has patient been taught Hand Expression?: No Does the patient have breastfeeding experience prior to this delivery?: Yes How long did the patient breastfeed?: 6 months ~6 years ago  Feeding Mother's Current Feeding Choice: Breast Milk  LATCH Score Latch: Grasps breast easily, tongue down, lips flanged, rhythmical sucking.  Audible Swallowing: Spontaneous and intermittent  Type of Nipple: Everted at rest and after stimulation  Comfort (Breast/Nipple): Soft / non-tender  Hold (Positioning): No assistance needed to correctly position infant at breast.  LATCH Score: 10  Interventions Interventions: Breast feeding basics  reviewed;Hand express;Skin to skin;Support pillows;Expressed milk;Education;LC Services brochure  Discharge Pump: Personal WIC Program: No  Consult Status Consult Status: Follow-up Date: 07/26/21 Follow-up type: In-patient    Corinthia Helmers A Higuera Ancidey 07/25/2021, 7:40 PM

## 2021-07-25 NOTE — Progress Notes (Signed)
Chelsea Middleton S/P VBAC  Live born female  Birth Weight: 4 lb 15.9 oz (2265 g) APGAR: 8, 9  Newborn Delivery   Birth date/time: 07/24/2021 12:14:00 Delivery type: VBAC, Spontaneous     Baby name: Undecided Delivering provider: Brien Few  Episiotomy:None   Lacerations:1st degree   Feeding: breast  Pain control at delivery: Epidural   S:  Reports feeling well overall. Some perineal soreness. Bleeding minimal. Breastfeeding going well.              Tolerating PO/No nausea or vomiting             Bleeding is light             Pain controlled with acetaminophen and ibuprofen (OTC)             Up ad lib/ambulatory/voiding without difficulties   O:  A & O x 3, in no apparent distress  Vitals:   07/24/21 2058 07/25/21 0148 07/25/21 0512 07/25/21 0904  BP: (!) 148/87 (!) 137/94 (!) 145/97 (!) 142/93  Pulse: 84 82 93   Resp:      Temp: 97.7 F (36.5 C) 97.9 F (36.6 C) 98.2 F (36.8 C)   TempSrc: Oral Oral Oral   SpO2: 100% 99% 100%   Weight:      Height:       Recent Labs    07/24/21 1240 07/25/21 0418  WBC 12.0* 11.0*  HGB 13.7 10.6*  HCT 42.Middleton 31.7*  PLT 212 176    Blood type: --/--/O POS (10/21 0355)  Rubella:   Immune  I&O: I/O last 3 completed shifts: In: -  Out: 2050 [Urine:2000; Blood:50]          No intake/output data recorded.   Gen: AAO x 3, NAD  Abdomen: soft, non-tender, non-distended             Fundus: firm, non-tender, U-Middleton  Perineum: repair intact  Lochia: small  Extremities: no edema, no calf pain or tenderness   A/P:  Chelsea Middleton 35 y.o., [id number]  Principal Problem:   Postpartum care following VBAC - 10/21  Doing well - stable status  Routine post partum orders Active Problems:   Encounter for induction of labor   VBAC, delivered   Perineal laceration with delivery, first degree and right periurethral  Discussed perineal care and comfort measures.    Chronic hypertension  Labs stable  BPs 140s/90s  Asymptomatic of PEC  Continue  labetalol 300mg  BID  Increase Procardia XL 30mg  to BID  Close monitoring of BP  Labs in AM  Suzan Nailer, MSN, CNM 07/25/2021, 10:51 AM

## 2021-07-26 LAB — COMPREHENSIVE METABOLIC PANEL
ALT: 21 U/L (ref 0–44)
AST: 30 U/L (ref 15–41)
Albumin: 2.4 g/dL — ABNORMAL LOW (ref 3.5–5.0)
Alkaline Phosphatase: 91 U/L (ref 38–126)
Anion gap: 8 (ref 5–15)
BUN: 8 mg/dL (ref 6–20)
CO2: 25 mmol/L (ref 22–32)
Calcium: 8.5 mg/dL — ABNORMAL LOW (ref 8.9–10.3)
Chloride: 104 mmol/L (ref 98–111)
Creatinine, Ser: 0.68 mg/dL (ref 0.44–1.00)
GFR, Estimated: >60 mL/min (ref >60)
Glucose, Bld: 80 mg/dL (ref 70–99)
Potassium: 3.6 mmol/L (ref 3.5–5.1)
Sodium: 137 mmol/L (ref 135–145)
Total Bilirubin: 0.3 mg/dL (ref 0.3–1.2)
Total Protein: 5.1 g/dL — ABNORMAL LOW (ref 6.5–8.1)

## 2021-07-26 LAB — CBC
HCT: 31.9 % — ABNORMAL LOW (ref 36.0–46.0)
Hemoglobin: 10.2 g/dL — ABNORMAL LOW (ref 12.0–15.0)
MCH: 26.4 pg (ref 26.0–34.0)
MCHC: 32 g/dL (ref 30.0–36.0)
MCV: 82.4 fL (ref 80.0–100.0)
Platelets: 198 10*3/uL (ref 150–400)
RBC: 3.87 MIL/uL (ref 3.87–5.11)
RDW: 14.6 % (ref 11.5–15.5)
WBC: 8.1 10*3/uL (ref 4.0–10.5)
nRBC: 0 % (ref 0.0–0.2)

## 2021-07-26 MED ORDER — NIFEDIPINE ER 30 MG PO TB24
30.0000 mg | ORAL_TABLET | Freq: Every day | ORAL | 1 refills | Status: DC
  Filled 2021-07-26: qty 30, 30d supply, fill #0
  Filled 2021-09-08: qty 30, 30d supply, fill #1

## 2021-07-26 MED ORDER — NIFEDIPINE ER 60 MG PO TB24
60.0000 mg | ORAL_TABLET | Freq: Every morning | ORAL | 1 refills | Status: DC
  Filled 2021-07-26: qty 30, 30d supply, fill #0
  Filled 2021-09-01: qty 30, 30d supply, fill #1

## 2021-07-26 MED ORDER — LABETALOL HCL 300 MG PO TABS
300.0000 mg | ORAL_TABLET | Freq: Two times a day (BID) | ORAL | 1 refills | Status: DC
  Filled 2021-07-26: qty 60, 30d supply, fill #0

## 2021-07-26 MED ORDER — IBUPROFEN 600 MG PO TABS
600.0000 mg | ORAL_TABLET | Freq: Four times a day (QID) | ORAL | 0 refills | Status: DC
  Filled 2021-07-26: qty 30, 8d supply, fill #0

## 2021-07-26 MED ORDER — NIFEDIPINE ER OSMOTIC RELEASE 30 MG PO TB24: 30.0000 mg | ORAL_TABLET | Freq: Every day | ORAL | Status: DC

## 2021-07-26 MED ORDER — ACETAMINOPHEN 325 MG PO TABS
650.0000 mg | ORAL_TABLET | ORAL | 1 refills | Status: DC | PRN
  Filled 2021-07-26: qty 30, 3d supply, fill #0

## 2021-07-26 MED ORDER — NIFEDIPINE ER OSMOTIC RELEASE 30 MG PO TB24
60.0000 mg | ORAL_TABLET | Freq: Every day | ORAL | Status: DC
  Administered 2021-07-26: 60 mg via ORAL
  Filled 2021-07-26: qty 2

## 2021-07-26 NOTE — Progress Notes (Signed)
PPD #2 S/P VBAC  Live born female  Birth Weight: 4 lb 15.9 oz (2265 g) APGAR: 8, 9  Newborn Delivery   Birth date/time: 07/24/2021 12:14:00 Delivery type: VBAC, Spontaneous     Baby name: Chelsea Middleton  Delivering provider: Brien Few   Episiotomy:None   Lacerations:1st degree   Feeding: breast  Pain control at delivery: Epidural   S:  Reports feeling well overall. Denies PEC symptoms.             Tolerating PO/No nausea or vomiting             Bleeding is light             Pain controlled with acetaminophen and ibuprofen (OTC)             Up ad lib/ambulatory/voiding without difficulties   O:  A & O x 3, in no apparent distress  Vitals:   07/25/21 1520 07/25/21 2224 07/26/21 0449 07/26/21 0946  BP: 140/88 (!) 143/110 (!) 143/100 (!) 143/93  Pulse: 95 94 77 91  Resp: 17 16 16    Temp: 98.1 F (36.7 C)  98.1 F (36.7 C)   TempSrc: Oral  Oral   SpO2:   99%   Weight:      Height:       Recent Labs    07/25/21 0418 07/26/21 0451  WBC 11.0* 8.1  HGB 10.6* 10.2*  HCT 31.7* 31.9*  PLT 176 198     Blood type: --/--/O POS (10/21 0355)  Rubella:   Immune  Gen: AAO x 3, NAD  Abdomen: soft, non-tender, non-distended            Fundus: firm, non-tender, U-2  Perineum: repair intact  Lochia: small  Extremities: no edema, no calf pain or tenderness   A/P:  PPD #2 35 y.o., [id number]   Principal Problem:   Postpartum care following VBAC - 10/21  Doing well - stable status  Routine post partum orders Active Problems:   Encounter for induction of labor   VBAC, delivered   Perineal laceration with delivery, first degree and right periurethral  Discussed perineal care and comfort measures.    Chronic hypertension  Labs stable this AM  BPs continue 140s/90s  Asymptomatic of PEC  Continue labetalol 300mg  BID  Per consult with Dr. Ronita Hipps, will increase Procardia XL 60mg  in the AM and 30mg  in PM  Will assess PM BP today and assess for readiness for  discharge  Anticipate discharge later today or tomorrow.   Chelsea Nailer, MSN, CNM 07/26/2021, 11:12 AM

## 2021-07-26 NOTE — Discharge Summary (Signed)
OB Discharge Summary  Patient Name: Chelsea Middleton DOB: [birth year] MRN: [id number]  Date of admission: 07/24/2021 Delivering provider: Brien Few   Admitting diagnosis: Encounter for induction of labor [Z34.90] Intrauterine pregnancy: [redacted]w[redacted]d     Secondary diagnosis: Patient Active Problem List   Diagnosis Date Noted   Chronic hypertension 07/25/2021   Encounter for induction of labor 07/24/2021   VBAC, delivered 07/24/2021   Postpartum care following VBAC - 10/21 07/24/2021   Perineal laceration with delivery, first degree and right periurethral 07/24/2021    Date of discharge: 07/26/2021   Discharge diagnosis: Principal Problem:   Postpartum care following VBAC - 10/21 Active Problems:   Encounter for induction of labor   VBAC, delivered   Perineal laceration with delivery, first degree and right periurethral   Chronic hypertension                                                            Post partum procedures: None  Augmentation: AROM and Pitocin Pain control: Epidural  Laceration:1st degree  Episiotomy:None  Complications: None  Hospital course:  Onset of Labor With Vaginal Delivery      35 y.o. yo [id number] at [redacted]w[redacted]d was admitted in Latent Labor on 07/24/2021. Patient had an uncomplicated labor course as follows:  Membrane Rupture Time/Date: 7:55 AM ,07/24/2021   Delivery Method:VBAC, Spontaneous  Episiotomy: None  Lacerations:  1st degree  Patient had a postpartum course complicated by chronic hypertension. Her labs remained stable and she increased her dose of Procardia to 60mg  in the AM and 30mg  in the PM. She is also taking labetalol 300mg  TID. She denies PEC symptoms. She will be closely followed postpartum in the office for BP. She is ambulating, tolerating a regular diet, passing flatus, and urinating well. Patient is discharged home in stable condition on 07/27/21.  Newborn Data: Birth date:07/24/2021  Birth time:12:14 PM  Gender:Female  Living  status:Living  Apgars:8 ,9  Weight:2265 g   Physical exam  Vitals:   07/25/21 1520 07/25/21 2224 07/26/21 0449 07/26/21 0946  BP: 140/88 (!) 143/110 (!) 143/100 (!) 143/93  Pulse: 95 94 77 91  Resp: 17 16 16    Temp: 98.1 F (36.7 C)  98.1 F (36.7 C)   TempSrc: Oral  Oral   SpO2:   99%   Weight:      Height:       General: alert, cooperative, and no distress Lochia: appropriate Uterine Fundus: firm Incision: N/A Perineum: repair intact, no edema DVT Evaluation: No evidence of DVT seen on physical exam.  Labs: Lab Results  Component Value Date   WBC 8.1 07/26/2021   HGB 10.2 (L) 07/26/2021   HCT 31.9 (L) 07/26/2021   MCV 82.4 07/26/2021   PLT 198 07/26/2021   CMP Latest Ref Rng & Units 07/26/2021  Glucose 70 - 99 mg/dL 80  BUN 6 - 20 mg/dL 8  Creatinine 0.44 - 1.00 mg/dL 0.68  Sodium 135 - 145 mmol/L 137  Potassium 3.5 - 5.1 mmol/L 3.6  Chloride 98 - 111 mmol/L 104  CO2 22 - 32 mmol/L 25  Calcium 8.9 - 10.3 mg/dL 8.5(L)  Total Protein 6.5 - 8.1 g/dL 5.1(L)  Total Bilirubin 0.3 - 1.2 mg/dL 0.3  Alkaline Phos 38 - 126 U/L 91  AST 15 - 41  U/L 30  ALT 0 - 44 U/L 21   Edinburgh Postnatal Depression Scale Screening Tool 07/24/2021 07/24/2021  I have been able to laugh and see the funny side of things. 0 (No Data)  I have looked forward with enjoyment to things. 0 -  I have blamed myself unnecessarily when things went wrong. 0 -  I have been anxious or worried for no good reason. 0 -  I have felt scared or panicky for no good reason. 0 -  Things have been getting on top of me. 0 -  I have been so unhappy that I have had difficulty sleeping. 0 -  I have felt sad or miserable. 0 -  I have been so unhappy that I have been crying. 0 -  The thought of harming myself has occurred to me. 0 -  Edinburgh Postnatal Depression Scale Total 0 -   Vaccines: TDaP UTD         COVID-19   UTD  Discharge instructions:  per After Visit Summary  After Visit Meds:  Allergies as  of 07/26/2021   No Known Allergies      Medication List     STOP taking these medications    clobetasol cream 0.05 % Commonly known as: TEMOVATE   metroNIDAZOLE 500 MG tablet Commonly known as: FLAGYL   promethazine 25 MG suppository Commonly known as: PHENERGAN   valACYclovir 500 MG tablet Commonly known as: Valtrex       TAKE these medications    acetaminophen 325 MG tablet Commonly known as: Tylenol Take 2 tablets (650 mg total) by mouth every 4 (four) hours as needed (for pain scale < 4).   b complex-C-E-zinc tablet Take 1 tablet by mouth daily.   FISH OIL PO Take 1 capsule by mouth 2 (two) times daily.   ibuprofen 600 MG tablet Commonly known as: ADVIL Take 1 tablet (600 mg total) by mouth every 6 (six) hours.   labetalol 300 MG tablet Commonly known as: NORMODYNE Take 1 tablet (300 mg total) by mouth 2 (two) times daily. What changed: Another medication with the same name was removed. Continue taking this medication, and follow the directions you see here.   NIFEdipine 60 MG 24 hr tablet Commonly known as: ADALAT CC Take 1 tablet (60 mg total) by mouth in the morning. What changed:  medication strength how much to take when to take this   NIFEdipine 30 MG 24 hr tablet Commonly known as: ADALAT CC Take 1 tablet (30 mg total) by mouth at bedtime. What changed: You were already taking a medication with the same name, and this prescription was added. Make sure you understand how and when to take each.   pantoprazole 40 MG tablet Commonly known as: Protonix Take 1 tablet (40 mg total) by mouth daily.   Vitafol Ultra 29-0.6-0.4-200 MG Caps Take 1 capsule by mouth daily       Diet: routine diet  Activity: Advance as tolerated. Pelvic rest for 6 weeks.   Newborn Data: Live born female  Birth Weight: 4 lb 15.9 oz (2265 g) APGAR: 8, 9  Newborn Delivery   Birth date/time: 07/24/2021 12:14:00 Delivery type: VBAC, Spontaneous     Named  Brielle Baby Feeding: Breast Disposition:home with mother  Delivery Report:  Review the Delivery Report for details.    Follow up:  Follow-up Information     Brien Few, MD. Schedule an appointment as soon as possible for a visit.   Specialty: Obstetrics and  Gynecology Why: Please make an appointment before the weekend for a blood pressure check. Contact information: [place] [id number] [place] Kenniya Westrich, CNM, MSN 07/26/2021, 12:47 PM

## 2021-07-26 NOTE — Lactation Note (Signed)
This note was copied from a baby's chart. Lactation Consultation Note  Patient Name: Chelsea Middleton [id number] Date: 07/26/2021 Reason for consult: Follow-up assessment Age:35 hours   P2 mother whose infant is now 60 hours old.  This is an ETI at 38+6 weeks.    Baby has been discharged; mother awaiting another BP check this afternoon to determine discharge status.  Mother had no questions/concerns related to breast feeding.  She feels like her daughter has been latching/feeding well; last LATCH score was a 10.  Baby is voiding/stooling.    Parents have our OP phone number for any questions/concerns after discharge.  Mother has a DEBP for home use.  Father present and supportive.   Maternal Data    Feeding    LATCH Score                    Lactation Tools Discussed/Used    Interventions    Discharge    Consult Status Consult Status: Complete Date: 07/26/21 Follow-up type: Call as needed    Diamantina Edinger R Levora Werden 07/26/2021, 10:46 AM

## 2021-07-27 ENCOUNTER — Other Ambulatory Visit (HOSPITAL_COMMUNITY): Payer: Self-pay

## 2021-07-27 DIAGNOSIS — R102 Pelvic and perineal pain: Secondary | ICD-10-CM | POA: Diagnosis not present

## 2021-07-28 ENCOUNTER — Other Ambulatory Visit (HOSPITAL_COMMUNITY): Payer: Self-pay

## 2021-07-31 DIAGNOSIS — O139 Gestational [pregnancy-induced] hypertension without significant proteinuria, unspecified trimester: Secondary | ICD-10-CM | POA: Diagnosis not present

## 2021-08-04 ENCOUNTER — Telehealth (HOSPITAL_COMMUNITY): Payer: Self-pay | Admitting: *Deleted

## 2021-08-04 ENCOUNTER — Other Ambulatory Visit (HOSPITAL_COMMUNITY): Payer: Self-pay

## 2021-08-04 NOTE — Telephone Encounter (Signed)
Attempted hospital discharge follow-up call. Left message for patient to return RN call. Erline Levine, RN, 08/04/21, [phone]

## 2021-08-05 ENCOUNTER — Other Ambulatory Visit: Payer: Self-pay

## 2021-08-05 ENCOUNTER — Other Ambulatory Visit (HOSPITAL_COMMUNITY): Payer: Self-pay

## 2021-08-05 DIAGNOSIS — J018 Other acute sinusitis: Secondary | ICD-10-CM

## 2021-08-05 MED ORDER — AZITHROMYCIN 250 MG PO TABS
ORAL_TABLET | ORAL | 2 refills | Status: DC
  Filled 2021-08-05: qty 6, 5d supply, fill #0
  Filled 2022-01-13: qty 6, 5d supply, fill #1

## 2021-08-05 MED ORDER — FLUCONAZOLE 150 MG PO TABS
150.0000 mg | ORAL_TABLET | Freq: Once | ORAL | 1 refills | Status: DC
  Filled 2021-08-05: qty 1, 1d supply, fill #0

## 2021-08-13 ENCOUNTER — Other Ambulatory Visit (HOSPITAL_COMMUNITY): Payer: Self-pay

## 2021-08-13 MED ORDER — HYDROCORTISONE 2.5 % EX CREA
TOPICAL_CREAM | CUTANEOUS | 0 refills | Status: DC
  Filled 2021-08-13: qty 30, 14d supply, fill #0

## 2021-08-14 ENCOUNTER — Other Ambulatory Visit (HOSPITAL_COMMUNITY): Payer: Self-pay

## 2021-08-21 ENCOUNTER — Other Ambulatory Visit (HOSPITAL_COMMUNITY): Payer: Self-pay

## 2021-08-24 ENCOUNTER — Other Ambulatory Visit (HOSPITAL_COMMUNITY): Payer: Self-pay

## 2021-08-24 MED ORDER — LABETALOL HCL 300 MG PO TABS
300.0000 mg | ORAL_TABLET | Freq: Two times a day (BID) | ORAL | 0 refills | Status: DC
  Filled 2021-08-24: qty 60, 30d supply, fill #0

## 2021-09-01 ENCOUNTER — Other Ambulatory Visit (HOSPITAL_COMMUNITY): Payer: Self-pay

## 2021-09-04 DIAGNOSIS — O10113 Pre-existing hypertensive heart disease complicating pregnancy, third trimester: Secondary | ICD-10-CM | POA: Diagnosis not present

## 2021-09-08 ENCOUNTER — Other Ambulatory Visit (HOSPITAL_COMMUNITY): Payer: Self-pay

## 2021-09-09 ENCOUNTER — Other Ambulatory Visit (HOSPITAL_COMMUNITY): Payer: Self-pay

## 2021-09-15 ENCOUNTER — Other Ambulatory Visit (HOSPITAL_COMMUNITY): Payer: Self-pay

## 2021-09-16 ENCOUNTER — Other Ambulatory Visit: Payer: Self-pay | Admitting: Obstetrics

## 2021-09-16 ENCOUNTER — Other Ambulatory Visit (HOSPITAL_COMMUNITY): Payer: Self-pay

## 2021-09-16 DIAGNOSIS — N898 Other specified noninflammatory disorders of vagina: Secondary | ICD-10-CM | POA: Diagnosis not present

## 2021-09-16 DIAGNOSIS — Z111 Encounter for screening for respiratory tuberculosis: Secondary | ICD-10-CM

## 2021-09-16 DIAGNOSIS — I10 Essential (primary) hypertension: Secondary | ICD-10-CM | POA: Diagnosis not present

## 2021-09-16 MED ORDER — METRONIDAZOLE 500 MG PO TABS
ORAL_TABLET | ORAL | 0 refills | Status: DC
  Filled 2021-09-16: qty 14, 7d supply, fill #0

## 2021-09-16 MED ORDER — NORETHINDRONE 0.35 MG PO TABS
ORAL_TABLET | ORAL | 3 refills | Status: DC
  Filled 2021-09-16: qty 84, 84d supply, fill #0
  Filled 2021-12-22: qty 84, 84d supply, fill #1
  Filled 2022-03-17: qty 84, 84d supply, fill #2
  Filled 2022-07-10: qty 84, 84d supply, fill #3

## 2021-09-22 LAB — QUANTIFERON-TB GOLD PLUS
QuantiFERON Mitogen Value: >10 IU/mL
QuantiFERON Nil Value: 0 IU/mL
QuantiFERON TB1 Ag Value: 0 IU/mL
QuantiFERON TB2 Ag Value: 0 IU/mL
QuantiFERON-TB Gold Plus: NEGATIVE

## 2021-10-01 ENCOUNTER — Other Ambulatory Visit (HOSPITAL_COMMUNITY): Payer: Self-pay

## 2021-10-01 MED ORDER — NIFEDIPINE ER 30 MG PO TB24
ORAL_TABLET | ORAL | 1 refills | Status: DC
  Filled 2021-10-01: qty 30, 30d supply, fill #0

## 2021-10-01 MED ORDER — NIFEDIPINE ER 60 MG PO TB24
ORAL_TABLET | ORAL | 1 refills | Status: DC
  Filled 2021-10-01: qty 30, 30d supply, fill #0

## 2021-10-02 ENCOUNTER — Other Ambulatory Visit (HOSPITAL_COMMUNITY): Payer: Self-pay

## 2021-10-15 DIAGNOSIS — I10 Essential (primary) hypertension: Secondary | ICD-10-CM | POA: Diagnosis not present

## 2021-11-04 ENCOUNTER — Other Ambulatory Visit (HOSPITAL_COMMUNITY): Payer: Self-pay

## 2021-11-04 DIAGNOSIS — I10 Essential (primary) hypertension: Secondary | ICD-10-CM | POA: Diagnosis not present

## 2021-11-04 MED ORDER — NIFEDIPINE ER 60 MG PO TB24
ORAL_TABLET | ORAL | 3 refills | Status: DC
  Filled 2021-11-04: qty 90, 90d supply, fill #0
  Filled 2022-02-22: qty 90, 90d supply, fill #1

## 2021-11-05 ENCOUNTER — Other Ambulatory Visit (HOSPITAL_COMMUNITY): Payer: Self-pay

## 2021-11-06 ENCOUNTER — Other Ambulatory Visit (HOSPITAL_COMMUNITY): Payer: Self-pay

## 2021-11-07 ENCOUNTER — Other Ambulatory Visit (HOSPITAL_COMMUNITY): Payer: Self-pay

## 2021-12-22 ENCOUNTER — Other Ambulatory Visit (HOSPITAL_COMMUNITY): Payer: Self-pay

## 2022-01-12 ENCOUNTER — Ambulatory Visit
Admission: EM | Admit: 2022-01-12 | Discharge: 2022-01-12 | Disposition: A | Discharge status: Home / Self Care | Payer: 59 | Attending: Physician Assistant | Admitting: Physician Assistant

## 2022-01-12 DIAGNOSIS — J029 Acute pharyngitis, unspecified: Secondary | ICD-10-CM | POA: Diagnosis not present

## 2022-01-12 LAB — POCT RAPID STREP A (OFFICE): Rapid Strep A Screen: NEGATIVE

## 2022-01-12 MED ORDER — LIDOCAINE VISCOUS HCL 2 % MT SOLN
15.0000 mL | OROMUCOSAL | 0 refills | Status: DC | PRN
  Filled 2022-01-12: qty 100, 10d supply, fill #0

## 2022-01-12 NOTE — Discharge Instructions (Signed)
?  Try CEPACOL lozenges. Follow up if symptoms persist or worsen.  ? ? ?

## 2022-01-12 NOTE — ED Provider Notes (Signed)
?[facility] ? ? ? ?CSN: [id number] ?Arrival date & time: 01/12/22  1820 ? ? ?  ? ?History   ?Chief Complaint ?Chief Complaint  ?Patient presents with  ? Sore Throat  ? ? ?HPI ?Chelsea Middleton is a 36 y.o. female.  ? ?Patient here today for evaluation of sore throat and stomachache that started 2 days ago.  She reports yesterday she did have cold chills.  She has not had any cough.  She denies any significant congestion.  She has been taking over-the-counter pain medication with mild relief. ? ?The history is provided by the patient.  ? ?Past Medical History:  ?Diagnosis Date  ? Asthma   ? HSV (herpes simplex virus) infection   ? Hypertension   ? Pemphigus foliaceous   ? ? ?Patient Active Problem List  ? Diagnosis Date Noted  ? Chronic hypertension 07/25/2021  ? Encounter for induction of labor 07/24/2021  ? VBAC, delivered 07/24/2021  ? Postpartum care following VBAC - 10/21 07/24/2021  ? Perineal laceration with delivery, first degree and right periurethral 07/24/2021  ? ? ?Past Surgical History:  ?Procedure Laterality Date  ? HERNIA REPAIR    ? TONSILLECTOMY    ? ? ?OB History   ? ? Gravida  ?4  ? Para  ?2  ? Term  ?2  ? Preterm  ?   ? AB  ?2  ? Living  ?2  ?  ? ? SAB  ?   ? IAB  ?2  ? Ectopic  ?   ? Multiple  ?0  ? Live Births  ?2  ?   ?  ?  ? ? ? ?Home Medications   ? ?Prior to Admission medications   ?Medication Sig Start Date End Date Taking? Authorizing Provider  ?lidocaine (XYLOCAINE) 2 % solution Use as directed 15 mLs in the mouth or throat as needed for mouth pain. 01/12/22  Yes Francene Finders, PA-C  ?acetaminophen (TYLENOL) 325 MG tablet Take 2 tablets (650 mg total) by mouth every 4 (four) hours as needed (for pain scale < 4). 07/26/21   Suzan Nailer, CNM  ?azithromycin (ZITHROMAX) 250 MG tablet Take 2 tablets by mouth the first day, then 1 tablet every day until completed. 08/05/21   Shelly Bombard, MD  ?B Complex-C-E-Zn (B COMPLEX-C-E-ZINC) tablet Take 1 tablet by mouth daily.  11/14/19   Shelly Bombard, MD  ?hydrocortisone 2.5 % cream APPLY A THIN LAYER TO THE AFFECTED AREA(S) OF SKIN 2 TIMES PER DAY 08/13/21     ?ibuprofen (ADVIL) 600 MG tablet Take 1 tablet (600 mg total) by mouth every 6 (six) hours. 07/26/21   Suzan Nailer, CNM  ?labetalol (NORMODYNE) 300 MG tablet Take 1 tablet (300 mg total) by mouth 2 (two) times daily. 07/26/21   Suzan Nailer, CNM  ?labetalol (NORMODYNE) 300 MG tablet Take 1 tablet by mouth twice a day 08/24/21     ?metroNIDAZOLE (FLAGYL) 500 MG tablet Take 1 tablet by mouth twice a day (no alcohol) 09/16/21     ?NIFEdipine (ADALAT CC) 30 MG 24 hr tablet Take 1 tablet (30 mg total) by mouth at bedtime. 10/01/21     ?NIFEdipine (ADALAT CC) 60 MG 24 hr tablet Take 1 tablet (60 mg total) by mouth in the morning. 10/01/21     ?NIFEdipine (ADALAT CC) 60 MG 24 hr tablet Take 1 tablet (60 mg total) by mouth in the morning. 11/04/21     ?norethindrone (CAMILA) 0.35  MG tablet Take 1 tablet by mouth every day . 09/16/21     ?Omega-3 Fatty Acids (FISH OIL PO) Take 1 capsule by mouth 2 (two) times daily.    [provider]  ?pantoprazole (PROTONIX) 40 MG tablet Take 1 tablet (40 mg total) by mouth daily. 05/28/21   Shelly Bombard, MD  ?Prenat-Fe Poly-Methfol-FA-DHA (VITAFOL ULTRA) 29-0.6-0.4-200 MG CAPS Take 1 capsule by mouth daily 04/09/21     ? ? ?Family History ?Family History  ?Problem Relation Age of Onset  ? Hypertension Mother   ? ? ?Social History ?Social History  ? ?Tobacco Use  ? Smoking status: Never  ? Smokeless tobacco: Never  ?Vaping Use  ? Vaping Use: Never used  ?Substance Use Topics  ? Alcohol use: Not Currently  ? Drug use: No  ? ? ? ?Allergies   ?Patient has no known allergies. ? ? ?Review of Systems ?Review of Systems  ?Constitutional:  Negative for chills and fever.  ?HENT:  Positive for sore throat. Negative for congestion.   ?Eyes:  Negative for discharge and redness.  ?Respiratory:  Negative for cough.   ?Gastrointestinal:  Positive for  abdominal pain. Negative for nausea and vomiting.  ? ? ?Physical Exam ?Triage Vital Signs ?ED Triage Vitals  ?Enc Vitals Group  ?   BP   ?   Pulse   ?   Resp   ?   Temp   ?   Temp src   ?   SpO2   ?   Weight   ?   Height   ?   Head Circumference   ?   Peak Flow   ?   Pain Score   ?   Pain Loc   ?   Pain Edu?   ?   Excl. in [place]?   ? ?No data found. ? ?Updated Vital Signs ?BP (!) 155/89 (BP Location: Left Arm)   Pulse (!) 115   Temp 98 ?F (36.7 ?C) (Oral)   Resp 16   SpO2 98%   Breastfeeding Yes  ?   ? ?Physical Exam ?Vitals and nursing note reviewed.  ?Constitutional:   ?   General: She is not in acute distress. ?   Appearance: Normal appearance. She is not ill-appearing.  ?HENT:  ?   Head: Normocephalic and atraumatic.  ?   Nose: Nose normal. No congestion or rhinorrhea.  ?   Mouth/Throat:  ?   Mouth: Mucous membranes are moist.  ?   Pharynx: Oropharynx is clear. No oropharyngeal exudate or posterior oropharyngeal erythema.  ?Eyes:  ?   Conjunctiva/sclera: Conjunctivae normal.  ?Cardiovascular:  ?   Rate and Rhythm: Normal rate.  ?Pulmonary:  ?   Effort: Pulmonary effort is normal. No respiratory distress.  ?Neurological:  ?   Mental Status: She is alert.  ?Psychiatric:     ?   Mood and Affect: Mood normal.     ?   Behavior: Behavior normal.     ?   Thought Content: Thought content normal.  ? ? ? ?UC Treatments / Results  ?Labs ?(all labs ordered are listed, but only abnormal results are displayed) ?Labs Reviewed  ?CULTURE, GROUP A STREP [facility])  ?NOVEL CORONAVIRUS, NAA  ?POCT RAPID STREP A (OFFICE)  ? ? ?EKG ? ? ?Radiology ?No results found. ? ?Procedures ?Procedures (including critical care time) ? ?Medications Ordered in UC ?Medications - No data to display ? ?Initial Impression / Assessment and Plan / UC Course  ?I  have reviewed the triage vital signs and the nursing notes. ? ?Pertinent labs & imaging results that were available during my care of the patient were reviewed by me and considered in my medical  decision making (see chart for details). ? ?  ?Drip screening negative.  Will order throat culture as well as COVID screening.  Discussed possible viral etiology and recommended symptomatic treatment.  Encouraged follow-up if symptoms fail to improve or worsen. ? ?Final Clinical Impressions(s) / UC Diagnoses  ? ?Final diagnoses:  ?Acute pharyngitis, unspecified etiology  ? ? ? ?Discharge Instructions   ? ?  ? ?Try CEPACOL lozenges. Follow up if symptoms persist or worsen.  ? ? ? ? ? ? ?ED Prescriptions   ? ? Medication Sig Dispense Auth. Provider  ? lidocaine (XYLOCAINE) 2 % solution Use as directed 15 mLs in the mouth or throat as needed for mouth pain. 100 mL Francene Finders, PA-C  ? ?  ? ?PDMP not reviewed this encounter. ?  ?Francene Finders, PA-C ?01/12/22 1933 ? ?

## 2022-01-12 NOTE — ED Triage Notes (Signed)
Pt presents with sore throat and stomach ache since Sunday/Monday. Reports cold chills yesterday. ?

## 2022-01-13 ENCOUNTER — Other Ambulatory Visit (HOSPITAL_COMMUNITY): Payer: Self-pay

## 2022-01-13 LAB — NOVEL CORONAVIRUS, NAA: SARS-CoV-2, NAA: NOT DETECTED

## 2022-01-15 LAB — CULTURE, GROUP A STREP (THRC)

## 2022-01-22 ENCOUNTER — Other Ambulatory Visit (HOSPITAL_COMMUNITY): Payer: Self-pay

## 2022-01-22 DIAGNOSIS — I10 Essential (primary) hypertension: Secondary | ICD-10-CM | POA: Diagnosis not present

## 2022-01-22 DIAGNOSIS — Z0000 Encounter for general adult medical examination without abnormal findings: Secondary | ICD-10-CM | POA: Diagnosis not present

## 2022-01-22 MED ORDER — LISINOPRIL-HYDROCHLOROTHIAZIDE 20-25 MG PO TABS
ORAL_TABLET | ORAL | 5 refills | Status: DC
  Filled 2022-01-22 – 2022-02-02 (×2): qty 30, 30d supply, fill #0
  Filled 2022-04-28: qty 30, 30d supply, fill #1
  Filled 2022-05-26: qty 30, 30d supply, fill #2
  Filled 2022-06-28: qty 30, 30d supply, fill #3
  Filled 2022-07-27: qty 30, 30d supply, fill #4
  Filled 2022-08-22: qty 30, 30d supply, fill #5

## 2022-02-01 ENCOUNTER — Other Ambulatory Visit (HOSPITAL_COMMUNITY): Payer: Self-pay

## 2022-02-02 ENCOUNTER — Other Ambulatory Visit (HOSPITAL_COMMUNITY): Payer: Self-pay

## 2022-02-19 ENCOUNTER — Other Ambulatory Visit (HOSPITAL_COMMUNITY): Payer: Self-pay

## 2022-02-19 ENCOUNTER — Other Ambulatory Visit: Payer: Self-pay | Admitting: Obstetrics

## 2022-02-19 DIAGNOSIS — I10 Essential (primary) hypertension: Secondary | ICD-10-CM

## 2022-02-19 MED ORDER — AMLODIPINE BESYLATE 5 MG PO TABS
5.0000 mg | ORAL_TABLET | Freq: Every day | ORAL | 11 refills | Status: DC
  Filled 2022-02-19: qty 30, 30d supply, fill #0

## 2022-02-22 ENCOUNTER — Other Ambulatory Visit (HOSPITAL_COMMUNITY): Payer: Self-pay

## 2022-02-23 ENCOUNTER — Other Ambulatory Visit (HOSPITAL_COMMUNITY): Payer: Self-pay

## 2022-03-17 ENCOUNTER — Other Ambulatory Visit (HOSPITAL_COMMUNITY): Payer: Self-pay

## 2022-03-23 ENCOUNTER — Other Ambulatory Visit (HOSPITAL_COMMUNITY)
Admission: RE | Admit: 2022-03-23 | Discharge: 2022-03-23 | Disposition: A | Discharge status: Home / Self Care | Payer: 59 | Source: Ambulatory Visit | Attending: Physician Assistant | Admitting: Physician Assistant

## 2022-03-23 ENCOUNTER — Encounter: Payer: Self-pay | Admitting: Physician Assistant

## 2022-03-23 ENCOUNTER — Ambulatory Visit (INDEPENDENT_AMBULATORY_CARE_PROVIDER_SITE_OTHER): Payer: 59 | Admitting: Physician Assistant

## 2022-03-23 VITALS — BP 132/80 | HR 94 | Temp 98.5°F | Ht 62.0 in | Wt 139.4 lb

## 2022-03-23 DIAGNOSIS — L109 Pemphigus, unspecified: Secondary | ICD-10-CM | POA: Diagnosis not present

## 2022-03-23 DIAGNOSIS — N760 Acute vaginitis: Secondary | ICD-10-CM | POA: Diagnosis not present

## 2022-03-23 DIAGNOSIS — I10 Essential (primary) hypertension: Secondary | ICD-10-CM

## 2022-03-23 LAB — POCT URINE PREGNANCY: Preg Test, Ur: NEGATIVE

## 2022-03-23 NOTE — Progress Notes (Signed)
Chelsea Middleton is a 36 y.o. female here for a follow up for HTN.   History of Present Illness:   Chief Complaint  Patient presents with   Establish Care   Hypertension    Pt does not like how it makes her feel and would like to discuss medications.    Vaginal Discharge    Pt c/o discharge, odor and spotting noticed today. Would like to be tested for STD's and BV.    HPI  HTN Patient here to establish care. Currently taking Nifedipine ER 60 mg daily.  Reports she started taking Nifedipine but this makes her heart feel "jittery". States she was started on blood pressure medication around age 84. She was taking Amlodipine 5 mg daily and Lisinopril-HCTZ 25 mg daily before her pregnancy and felt well controlled with this.  During her pregnancy, she was also on Labetalol 300 mg -- in addition to her Nifedipine.  At home blood pressure readings are: checked regularly. 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 Patient denies chest pain, SOB, blurred vision, dizziness, unusual headaches, lower leg swelling. Patient is  compliant with medication. Denies excessive caffeine intake, stimulant usage, excessive alcohol intake, or increase in salt consumption.  BP Readings from Last 3 Encounters:  03/23/22 132/80  01/12/22 (!) 155/89  01/28/21 (!) 164/100    Vaginal Discharge  Patient presents with c/o intermittent vaginal discharge with odor that has been since this morning. She states her menstrual cycle ended last Sunday. She has had noticed some spotting during her periods. She is currently taking Micronor. She does have a history of BV and would like to be tested to STDs and BV. Denies pelvic pain. Denies burning with urination. No reported vaginal irritation or itching.   Pemphigus  Patient states she has been dealing with since her 76's. States her symptoms usually include blister and itching. This usually appear anywhere in her body. She  has seen dermatologist in the past and has been following with them once a year. Her symptoms are well controlled at this time.   Past Medical History:  Diagnosis Date   Asthma    HSV (herpes simplex virus) infection    Hypertension    Pemphigus foliaceous      Social History   Tobacco Use   Smoking status: Never   Smokeless tobacco: Never  Vaping Use   Vaping Use: Never used  Substance Use Topics   Alcohol use: Yes    Comment: Occasionally   Drug use: No    Past Surgical History:  Procedure Laterality Date   CESAREAN SECTION     HERNIA REPAIR     TONSILLECTOMY      Family History  Problem Relation Age of Onset   Hypertension Mother    Asthma Sister    Colon cancer Neg Hx    Breast cancer Neg Hx     No Known Allergies  Current Medications:   Current Outpatient Medications:    acetaminophen (TYLENOL) 325 MG tablet, Take 2 tablets (650 mg total) by mouth every 4 (four) hours as needed (for pain scale < 4)., Disp: 30 tablet, Rfl: 1   ibuprofen (ADVIL) 600 MG tablet, Take 1 tablet (600 mg total) by mouth every 6 (six) hours., Disp: 30 tablet, Rfl: 0   lidocaine (XYLOCAINE) 2 % solution, Use as directed 15 mLs in the mouth or throat as needed for mouth pain., Disp: 100 mL, Rfl: 0  metroNIDAZOLE (FLAGYL) 500 MG tablet, Take 1 tablet by mouth twice a day (no alcohol), Disp: 14 tablet, Rfl: 0   NIFEdipine (ADALAT CC) 60 MG 24 hr tablet, Take 1 tablet (60 mg total) by mouth in the morning., Disp: 30 tablet, Rfl: 1   norethindrone (CAMILA) 0.35 MG tablet, Take 1 tablet by mouth every day ., Disp: 90 tablet, Rfl: 3   Omega-3 Fatty Acids (FISH OIL PO), Take 1 capsule by mouth 2 (two) times daily., Disp: , Rfl:    pantoprazole (PROTONIX) 40 MG tablet, Take 1 tablet (40 mg total) by mouth daily., Disp: 30 tablet, Rfl: 5   Prenat-Fe Poly-Methfol-FA-DHA (VITAFOL ULTRA) 29-0.6-0.4-200 MG CAPS, Take 1 capsule by mouth daily, Disp: 90 capsule, Rfl: 3   amLODipine (NORVASC) 5 MG  tablet, Take 1 tablet (5 mg total) by mouth daily. (Patient not taking: Reported on 03/23/2022), Disp: 30 tablet, Rfl: 11   lisinopril-hydrochlorothiazide (ZESTORETIC) 20-25 MG tablet, Take 1 tablet by mouth once a day. (Patient not taking: Reported on 03/23/2022), Disp: 30 tablet, Rfl: 5   Review of Systems:   ROS Negative unless otherwise specified per HPI.   Vitals:   Vitals:   03/23/22 1503 03/23/22 1547  BP: (!) 139/91 132/80  Pulse: 94   Temp: 98.5 F (36.9 C)   TempSrc: Temporal   SpO2: 100%   Weight: 139 lb 6 oz (63.2 kg)   Height: '5\' 2"'$  (1.575 m)      Body mass index is 25.49 kg/m.  Physical Exam:   Physical Exam Vitals and nursing note reviewed.  Constitutional:      General: She is not in acute distress.    Appearance: She is well-developed. She is not ill-appearing or toxic-appearing.  Cardiovascular:     Rate and Rhythm: Normal rate and regular rhythm.     Pulses: Normal pulses.     Heart sounds: Normal heart sounds, S1 normal and S2 normal.  Pulmonary:     Effort: Pulmonary effort is normal.     Breath sounds: Normal breath sounds.  Skin:    General: Skin is warm and dry.  Neurological:     Mental Status: She is alert.     GCS: GCS eye subscore is 4. GCS verbal subscore is 5. GCS motor subscore is 6.  Psychiatric:        Speech: Speech normal.        Behavior: Behavior normal. Behavior is cooperative.    Patient obtained self swab  Results for orders placed or performed in visit on 03/23/22  POCT urine pregnancy  Result Value Ref Range   Preg Test, Ur Negative Negative      Assessment and Plan:   Chronic hypertension No red flags Update blood work today As long as everything looks good, we will likely decrease Nifedipine to 30 mg daily and re-start Lisinopril - HCTZ 20-25 mg daily  Follow-up in 1 month (via mychart) and titrate medications prn Low threshold to send to Cardiology if needed - would be a good candidate for HTN Clinic with Dr  Oval Linsey  Acute vaginitis Unclear etiology Urine pregnancy test is negative Cervical swab obtained   Pemphigus Patient reports that this is stable Follow-up with derm prn   I,Savera Zaman,acting as a scribe for [employer], PA.,have documented all relevant documentation on the behalf of Chelsea Coke, PA,as directed by  Chelsea Coke, PA while in the presence of Chelsea Middleton, [place].   I, Chelsea Middleton, [place], have reviewed all documentation for  this visit. The documentation on 03/23/22 for the exam, diagnosis, procedures, and orders are all accurate and complete.   Chelsea Coke, PA-C

## 2022-03-23 NOTE — Patient Instructions (Signed)
It was great to see you!  Update blood work today  As long as everything looks good, we will likely decrease Nifedipine to 30 mg daily and re-start Lisinopril - hctz 20-25 mg daily --> I will contact you via MyChart with your results from today with these formal recommendations.  We will have you take this for a month and report back via MyChart with your average blood pressure readings -- alternatively, you can schedule an office visit We will taper medications from there  I will be in touch with your blood work and vaginal swab results  Let's follow-up in 1 month, sooner if you have concerns.  Take care,  Inda Coke PA-C

## 2022-03-24 LAB — CBC WITH DIFFERENTIAL/PLATELET
Basophils Absolute: 0.1 10*3/uL (ref 0.0–0.1)
Basophils Relative: 1 % (ref 0.0–3.0)
Eosinophils Absolute: 0.3 10*3/uL (ref 0.0–0.7)
Eosinophils Relative: 5 % (ref 0.0–5.0)
HCT: 39.5 % (ref 36.0–46.0)
Hemoglobin: 13.2 g/dL (ref 12.0–15.0)
Lymphocytes Relative: 44.2 % (ref 12.0–46.0)
Lymphs Abs: 2.4 10*3/uL (ref 0.7–4.0)
MCHC: 33.4 g/dL (ref 30.0–36.0)
MCV: 84.3 fl (ref 78.0–100.0)
Monocytes Absolute: 0.5 10*3/uL (ref 0.1–1.0)
Monocytes Relative: 10 % (ref 3.0–12.0)
Neutro Abs: 2.1 10*3/uL (ref 1.4–7.7)
Neutrophils Relative %: 39.8 % — ABNORMAL LOW (ref 43.0–77.0)
Platelets: 262 10*3/uL (ref 150.0–400.0)
RBC: 4.69 Mil/uL (ref 3.87–5.11)
RDW: 13 % (ref 11.5–15.5)
WBC: 5.3 10*3/uL (ref 4.0–10.5)

## 2022-03-24 LAB — COMPREHENSIVE METABOLIC PANEL
ALT: 12 U/L (ref 0–35)
AST: 20 U/L (ref 0–37)
Albumin: 4.6 g/dL (ref 3.5–5.2)
Alkaline Phosphatase: 84 U/L (ref 39–117)
BUN: 8 mg/dL (ref 6–23)
CO2: 31 mEq/L (ref 19–32)
Calcium: 9.5 mg/dL (ref 8.4–10.5)
Chloride: 101 mEq/L (ref 96–112)
Creatinine, Ser: 0.76 mg/dL (ref 0.40–1.20)
GFR: 101.38 mL/min (ref >60.00)
Glucose, Bld: 72 mg/dL (ref 70–99)
Potassium: 4.2 mEq/L (ref 3.5–5.1)
Sodium: 138 mEq/L (ref 135–145)
Total Bilirubin: 0.3 mg/dL (ref 0.2–1.2)
Total Protein: 7.6 g/dL (ref 6.0–8.3)

## 2022-03-24 LAB — LIPID PANEL
Cholesterol: 182 mg/dL (ref 0–200)
HDL: 43.9 mg/dL (ref >39.00)
LDL Cholesterol: 102 mg/dL — ABNORMAL HIGH (ref 0–99)
NonHDL: 138.47
Total CHOL/HDL Ratio: 4
Triglycerides: 184 mg/dL — ABNORMAL HIGH (ref 0.0–149.0)
VLDL: 36.8 mg/dL (ref 0.0–40.0)

## 2022-03-24 LAB — TSH: TSH: 1.03 u[IU]/mL (ref 0.35–5.50)

## 2022-03-25 ENCOUNTER — Encounter: Payer: Self-pay | Admitting: Physician Assistant

## 2022-03-25 ENCOUNTER — Other Ambulatory Visit: Payer: Self-pay | Admitting: Physician Assistant

## 2022-03-25 ENCOUNTER — Other Ambulatory Visit (HOSPITAL_COMMUNITY): Payer: Self-pay

## 2022-03-25 LAB — CERVICOVAGINAL ANCILLARY ONLY
Bacterial Vaginitis (gardnerella): POSITIVE — AB
Candida Glabrata: NEGATIVE
Candida Vaginitis: NEGATIVE
Chlamydia: NEGATIVE
Comment: NEGATIVE
Comment: NEGATIVE
Comment: NEGATIVE
Comment: NEGATIVE
Comment: NEGATIVE
Comment: NORMAL
Neisseria Gonorrhea: NEGATIVE
Trichomonas: NEGATIVE

## 2022-03-25 MED ORDER — FLUCONAZOLE 150 MG PO TABS
150.0000 mg | ORAL_TABLET | Freq: Once | ORAL | 0 refills | Status: DC
  Filled 2022-03-25: qty 1, 1d supply, fill #0

## 2022-03-25 MED ORDER — METRONIDAZOLE 500 MG PO TABS
ORAL_TABLET | ORAL | 0 refills | Status: DC
  Filled 2022-03-25: qty 14, 7d supply, fill #0

## 2022-04-14 ENCOUNTER — Other Ambulatory Visit: Payer: Self-pay | Admitting: Physician Assistant

## 2022-04-14 ENCOUNTER — Encounter: Payer: Self-pay | Admitting: Physician Assistant

## 2022-04-14 ENCOUNTER — Other Ambulatory Visit (HOSPITAL_COMMUNITY): Payer: Self-pay

## 2022-04-14 MED ORDER — NIFEDIPINE ER OSMOTIC RELEASE 30 MG PO TB24
30.0000 mg | ORAL_TABLET | Freq: Every day | ORAL | 1 refills | Status: DC
  Filled 2022-04-14: qty 30, 30d supply, fill #0
  Filled 2022-05-10: qty 30, 30d supply, fill #1

## 2022-04-14 NOTE — Telephone Encounter (Signed)
Please see message and advise if okay to decrease Propranolol?

## 2022-04-28 ENCOUNTER — Other Ambulatory Visit (HOSPITAL_COMMUNITY): Payer: Self-pay

## 2022-05-10 ENCOUNTER — Other Ambulatory Visit (HOSPITAL_COMMUNITY): Payer: Self-pay

## 2022-05-10 DIAGNOSIS — Z79899 Other long term (current) drug therapy: Secondary | ICD-10-CM | POA: Diagnosis not present

## 2022-05-10 DIAGNOSIS — L102 Pemphigus foliaceous: Secondary | ICD-10-CM | POA: Diagnosis not present

## 2022-05-14 ENCOUNTER — Other Ambulatory Visit (HOSPITAL_COMMUNITY): Payer: Self-pay

## 2022-05-18 DIAGNOSIS — Z79899 Other long term (current) drug therapy: Secondary | ICD-10-CM | POA: Diagnosis not present

## 2022-05-26 ENCOUNTER — Other Ambulatory Visit (HOSPITAL_COMMUNITY): Payer: Self-pay

## 2022-05-27 ENCOUNTER — Other Ambulatory Visit (HOSPITAL_COMMUNITY): Payer: Self-pay

## 2022-06-06 ENCOUNTER — Encounter: Payer: Self-pay | Admitting: Physician Assistant

## 2022-06-06 ENCOUNTER — Other Ambulatory Visit: Payer: Self-pay | Admitting: Physician Assistant

## 2022-06-08 ENCOUNTER — Other Ambulatory Visit: Payer: Self-pay | Admitting: Physician Assistant

## 2022-06-08 ENCOUNTER — Other Ambulatory Visit (HOSPITAL_COMMUNITY): Payer: Self-pay

## 2022-06-08 DIAGNOSIS — I10 Essential (primary) hypertension: Secondary | ICD-10-CM

## 2022-06-08 MED ORDER — NIFEDIPINE ER OSMOTIC RELEASE 30 MG PO TB24
30.0000 mg | ORAL_TABLET | Freq: Every day | ORAL | 2 refills | Status: DC
  Filled 2022-06-08: qty 30, 30d supply, fill #0

## 2022-06-09 ENCOUNTER — Other Ambulatory Visit (HOSPITAL_COMMUNITY): Payer: Self-pay

## 2022-06-09 ENCOUNTER — Other Ambulatory Visit: Payer: Self-pay | Admitting: Physician Assistant

## 2022-06-09 MED ORDER — LISINOPRIL 20 MG PO TABS
20.0000 mg | ORAL_TABLET | Freq: Every day | ORAL | 0 refills | Status: DC
  Filled 2022-06-09: qty 90, 90d supply, fill #0

## 2022-06-17 ENCOUNTER — Other Ambulatory Visit (HOSPITAL_COMMUNITY): Payer: Self-pay

## 2022-06-22 ENCOUNTER — Other Ambulatory Visit: Payer: Self-pay | Admitting: Physician Assistant

## 2022-06-22 ENCOUNTER — Other Ambulatory Visit (HOSPITAL_COMMUNITY): Payer: Self-pay

## 2022-06-22 MED ORDER — NIFEDIPINE ER OSMOTIC RELEASE 30 MG PO TB24
30.0000 mg | ORAL_TABLET | Freq: Every day | ORAL | 1 refills | Status: DC
  Filled 2022-06-22: qty 90, 90d supply, fill #0
  Filled 2022-09-17: qty 90, 90d supply, fill #1

## 2022-06-28 ENCOUNTER — Encounter: Payer: Self-pay | Admitting: *Deleted

## 2022-06-28 ENCOUNTER — Other Ambulatory Visit (HOSPITAL_COMMUNITY): Payer: Self-pay

## 2022-07-10 ENCOUNTER — Other Ambulatory Visit (HOSPITAL_COMMUNITY): Payer: Self-pay

## 2022-07-27 ENCOUNTER — Other Ambulatory Visit (HOSPITAL_COMMUNITY): Payer: Self-pay

## 2022-08-04 ENCOUNTER — Other Ambulatory Visit (HOSPITAL_COMMUNITY): Payer: Self-pay

## 2022-08-04 ENCOUNTER — Other Ambulatory Visit: Payer: Self-pay | Admitting: Obstetrics

## 2022-08-04 DIAGNOSIS — B9689 Other specified bacterial agents as the cause of diseases classified elsewhere: Secondary | ICD-10-CM

## 2022-08-04 DIAGNOSIS — B379 Candidiasis, unspecified: Secondary | ICD-10-CM

## 2022-08-04 MED ORDER — METRONIDAZOLE 500 MG PO TABS
500.0000 mg | ORAL_TABLET | Freq: Two times a day (BID) | ORAL | 2 refills | Status: DC
  Filled 2022-08-04: qty 14, 7d supply, fill #0
  Filled 2022-09-17: qty 14, 7d supply, fill #1
  Filled 2022-11-25: qty 14, 7d supply, fill #2

## 2022-08-04 MED ORDER — FLUCONAZOLE 150 MG PO TABS
150.0000 mg | ORAL_TABLET | Freq: Once | ORAL | 2 refills | Status: DC
Start: 2022-08-04 — End: 2022-08-05
  Filled 2022-08-04: qty 1, 1d supply, fill #0

## 2022-08-23 ENCOUNTER — Other Ambulatory Visit (HOSPITAL_COMMUNITY): Payer: Self-pay

## 2022-08-24 DIAGNOSIS — H52223 Regular astigmatism, bilateral: Secondary | ICD-10-CM | POA: Diagnosis not present

## 2022-09-16 ENCOUNTER — Encounter: Payer: Self-pay | Admitting: *Deleted

## 2022-09-17 DIAGNOSIS — Z01419 Encounter for gynecological examination (general) (routine) without abnormal findings: Secondary | ICD-10-CM | POA: Diagnosis not present

## 2022-09-17 DIAGNOSIS — Z01411 Encounter for gynecological examination (general) (routine) with abnormal findings: Secondary | ICD-10-CM | POA: Diagnosis not present

## 2022-09-17 DIAGNOSIS — Z114 Encounter for screening for human immunodeficiency virus [HIV]: Secondary | ICD-10-CM | POA: Diagnosis not present

## 2022-09-17 DIAGNOSIS — Z124 Encounter for screening for malignant neoplasm of cervix: Secondary | ICD-10-CM | POA: Diagnosis not present

## 2022-09-17 DIAGNOSIS — Z113 Encounter for screening for infections with a predominantly sexual mode of transmission: Secondary | ICD-10-CM | POA: Diagnosis not present

## 2022-09-17 DIAGNOSIS — Z1159 Encounter for screening for other viral diseases: Secondary | ICD-10-CM | POA: Diagnosis not present

## 2022-09-17 LAB — HM PAP SMEAR

## 2022-09-17 LAB — HEPATITIS B SURFACE ANTIGEN: Hepatitis B Surface Ag: NEGATIVE

## 2022-09-17 LAB — RESULTS CONSOLE HPV: CHL HPV: NEGATIVE

## 2022-09-23 ENCOUNTER — Encounter: Payer: Self-pay | Admitting: Physician Assistant

## 2022-09-24 ENCOUNTER — Other Ambulatory Visit: Payer: Self-pay | Admitting: Physician Assistant

## 2022-09-24 ENCOUNTER — Other Ambulatory Visit (HOSPITAL_COMMUNITY): Payer: Self-pay

## 2022-09-24 MED ORDER — LISINOPRIL-HYDROCHLOROTHIAZIDE 20-25 MG PO TABS
1.0000 | ORAL_TABLET | Freq: Every day | ORAL | 3 refills | Status: DC
  Filled 2022-09-24: qty 90, 90d supply, fill #0
  Filled 2022-12-18: qty 90, 90d supply, fill #1

## 2022-09-24 MED ORDER — LISINOPRIL 20 MG PO TABS
20.0000 mg | ORAL_TABLET | Freq: Every day | ORAL | 1 refills | Status: DC
  Filled 2022-09-24: qty 90, 90d supply, fill #0

## 2022-10-05 ENCOUNTER — Other Ambulatory Visit: Payer: Self-pay

## 2022-10-05 DIAGNOSIS — Z111 Encounter for screening for respiratory tuberculosis: Secondary | ICD-10-CM | POA: Diagnosis not present

## 2022-10-07 LAB — QUANTIFERON-TB GOLD PLUS
QuantiFERON Mitogen Value: >10 IU/mL
QuantiFERON Nil Value: 0.01 IU/mL
QuantiFERON TB1 Ag Value: 0.02 IU/mL
QuantiFERON TB2 Ag Value: 0.02 IU/mL
QuantiFERON-TB Gold Plus: NEGATIVE

## 2022-11-05 DIAGNOSIS — L102 Pemphigus foliaceous: Secondary | ICD-10-CM | POA: Diagnosis not present

## 2022-11-17 ENCOUNTER — Other Ambulatory Visit (HOSPITAL_COMMUNITY): Payer: Self-pay

## 2022-11-17 DIAGNOSIS — Z1159 Encounter for screening for other viral diseases: Secondary | ICD-10-CM | POA: Diagnosis not present

## 2022-11-17 DIAGNOSIS — Z118 Encounter for screening for other infectious and parasitic diseases: Secondary | ICD-10-CM | POA: Diagnosis not present

## 2022-11-17 DIAGNOSIS — Z113 Encounter for screening for infections with a predominantly sexual mode of transmission: Secondary | ICD-10-CM | POA: Diagnosis not present

## 2022-11-17 DIAGNOSIS — Z114 Encounter for screening for human immunodeficiency virus [HIV]: Secondary | ICD-10-CM | POA: Diagnosis not present

## 2022-11-17 LAB — HIV ANTIBODY (ROUTINE TESTING W REFLEX): HIV 1&2 Ab, 4th Generation: NONREACTIVE

## 2022-11-17 LAB — OB RESULTS CONSOLE GC/CHLAMYDIA: Chlamydia: NEGATIVE

## 2022-11-17 LAB — HM HEPATITIS C SCREENING LAB: HM Hepatitis Screen: NEGATIVE

## 2022-11-17 LAB — HM HIV SCREENING LAB: HM HIV Screening: NEGATIVE

## 2022-11-17 MED ORDER — FLUCONAZOLE 150 MG PO TABS
150.0000 mg | ORAL_TABLET | ORAL | 0 refills | Status: DC
  Filled 2022-11-17: qty 3, 7d supply, fill #0

## 2022-11-19 DIAGNOSIS — L102 Pemphigus foliaceous: Secondary | ICD-10-CM | POA: Diagnosis not present

## 2022-12-12 ENCOUNTER — Other Ambulatory Visit: Payer: Self-pay | Admitting: Physician Assistant

## 2022-12-13 ENCOUNTER — Other Ambulatory Visit (HOSPITAL_COMMUNITY): Payer: Self-pay

## 2022-12-13 MED ORDER — NIFEDIPINE ER OSMOTIC RELEASE 30 MG PO TB24
30.0000 mg | ORAL_TABLET | Freq: Every day | ORAL | 0 refills | Status: DC
  Filled 2022-12-13: qty 30, 30d supply, fill #0

## 2022-12-21 ENCOUNTER — Other Ambulatory Visit (HOSPITAL_COMMUNITY): Payer: Self-pay

## 2023-01-13 ENCOUNTER — Other Ambulatory Visit: Payer: Self-pay | Admitting: Physician Assistant

## 2023-01-18 ENCOUNTER — Other Ambulatory Visit (HOSPITAL_COMMUNITY): Payer: Self-pay

## 2023-01-18 ENCOUNTER — Encounter: Payer: Self-pay | Admitting: Physician Assistant

## 2023-01-18 ENCOUNTER — Other Ambulatory Visit: Payer: Self-pay | Admitting: Physician Assistant

## 2023-01-18 MED ORDER — NIFEDIPINE ER OSMOTIC RELEASE 30 MG PO TB24
30.0000 mg | ORAL_TABLET | Freq: Every day | ORAL | 0 refills | Status: DC
  Filled 2023-01-18: qty 30, 30d supply, fill #0

## 2023-01-18 NOTE — Telephone Encounter (Signed)
Please advise if okay to refill Nifedipine 30 mg. Pt scheduled an appt on 02/04/2023 for annual exam.

## 2023-02-04 ENCOUNTER — Encounter: Payer: Self-pay | Admitting: Physician Assistant

## 2023-02-04 ENCOUNTER — Ambulatory Visit (INDEPENDENT_AMBULATORY_CARE_PROVIDER_SITE_OTHER): Payer: Commercial Managed Care - PPO | Admitting: Physician Assistant

## 2023-02-04 ENCOUNTER — Other Ambulatory Visit (HOSPITAL_COMMUNITY): Payer: Self-pay

## 2023-02-04 VITALS — BP 156/100 | HR 72 | Temp 97.5°F | Ht 62.0 in | Wt 136.2 lb

## 2023-02-04 DIAGNOSIS — I10 Essential (primary) hypertension: Secondary | ICD-10-CM | POA: Diagnosis not present

## 2023-02-04 DIAGNOSIS — Z0000 Encounter for general adult medical examination without abnormal findings: Secondary | ICD-10-CM | POA: Diagnosis not present

## 2023-02-04 DIAGNOSIS — Z3041 Encounter for surveillance of contraceptive pills: Secondary | ICD-10-CM | POA: Diagnosis not present

## 2023-02-04 MED ORDER — NORETHINDRONE 0.35 MG PO TABS
1.0000 | ORAL_TABLET | Freq: Every day | ORAL | 3 refills | Status: DC
  Filled 2023-02-04: qty 84, 84d supply, fill #0
  Filled 2023-04-25 – 2023-05-05 (×2): qty 84, 84d supply, fill #1
  Filled 2023-07-23: qty 84, 84d supply, fill #2

## 2023-02-04 MED ORDER — LISINOPRIL-HYDROCHLOROTHIAZIDE 20-25 MG PO TABS
1.0000 | ORAL_TABLET | Freq: Every day | ORAL | 3 refills | Status: DC
  Filled 2023-02-04 – 2023-03-18 (×2): qty 90, 90d supply, fill #0
  Filled 2023-06-16: qty 90, 90d supply, fill #1
  Filled 2023-09-12: qty 90, 90d supply, fill #2
  Filled 2023-12-14: qty 90, 90d supply, fill #3

## 2023-02-04 NOTE — Progress Notes (Signed)
Subjective:    Chelsea Middleton is a 37 y.o. female and is here for a comprehensive physical exam.  HPI  Health Maintenance Due  Topic Date Due   COVID-19 Vaccine (1) Never done   HIV Screening  Never done   Hepatitis C Screening  Never done   DTaP/Tdap/Td (1 - Tdap) Never done   PAP SMEAR-Modifier  Never done    Acute Concerns: No acute concerns were discussed this visit.  Chronic Issues: Hypertension Patient reports that she took her blood pressure medication today and checks her blood pressure at home. She is compliant with 20-25 mg lisinopril-hydrochlorothiazide and nifedipine 30 mg XL daily with no issues.  Oral contraception use Patient is requested to change contraception due to hypertension.  She is currently taking COC that was started by obstetrics-gynecology.   Health Maintenance: Diet -- She does not maintain a well balanced diet. Exercise -- Patient states that she has recently started participating in physical activity.  Sleep habits -- She reports no trouble sleeping. Mood -- She is in a stable mood this visit.  UTD with dentist? - She is not UTD on dental care. UTD with eye doctor? - She is UTD on vision care.  Weight history: Wt Readings from Last 10 Encounters:  03/23/22 139 lb 6 oz (63.2 kg)  01/28/21 151 lb 8 oz (68.7 kg)  03/30/14 125 lb (56.7 kg)   There is no height or weight on file to calculate BMI. No LMP recorded.  Alcohol use:  reports current alcohol use.  Tobacco use:  Tobacco Use: Low Risk  (12/19/2022)   Patient History    Smoking Tobacco Use: Never    Smokeless Tobacco Use: Never    Passive Exposure: Not on file   Eligible for lung cancer screening? no     03/23/2022    3:01 PM  Depression screen PHQ 2/9  Decreased Interest 0  Down, Depressed, Hopeless 0  PHQ - 2 Score 0     Other providers/specialists: Patient Care Team: Jarold Motto, [place] as PCP - General (Physician Assistant)    PMHx, SurgHx, SocialHx,  Medications, and Allergies were reviewed in the Visit Navigator and updated as appropriate.   Past Medical History:  Diagnosis Date   Asthma    HSV (herpes simplex virus) infection    Hypertension    Pemphigus foliaceous      Past Surgical History:  Procedure Laterality Date   CESAREAN SECTION     HERNIA REPAIR     TONSILLECTOMY       Family History  Problem Relation Age of Onset   Hypertension Mother    Asthma Sister    Colon cancer Neg Hx    Breast cancer Neg Hx     Social History   Tobacco Use   Smoking status: Never   Smokeless tobacco: Never  Vaping Use   Vaping Use: Never used  Substance Use Topics   Alcohol use: Yes    Comment: Occasionally   Drug use: No    Review of Systems:   Review of Systems  Constitutional:  Negative for chills, fever, malaise/fatigue and weight loss.  HENT:  Negative for hearing loss, sinus pain and sore throat.   Respiratory:  Negative for cough and hemoptysis.   Cardiovascular:  Negative for chest pain, palpitations, leg swelling and PND.  Gastrointestinal:  Negative for abdominal pain, constipation, diarrhea, heartburn, nausea and vomiting.  Genitourinary:  Negative for dysuria, frequency and urgency.  Musculoskeletal:  Negative for  back pain, myalgias and neck pain.  Skin:  Negative for itching and rash.  Endo/Heme/Allergies:  Negative for polydipsia.  Psychiatric/Behavioral:  Negative for depression. The patient is not nervous/anxious.     Objective:   There were no vitals taken for this visit. There is no height or weight on file to calculate BMI.   General Appearance:    Alert, cooperative, no distress, appears stated age  Head:    Normocephalic, without obvious abnormality, atraumatic  Eyes:    PERRL, conjunctiva/corneas clear, EOM's intact, fundi    benign, both eyes  Ears:    Normal TM's and external ear canals, both ears  Nose:   Nares normal, septum midline, mucosa normal, no drainage    or sinus tenderness   Throat:   Lips, mucosa, and tongue normal; teeth and gums normal  Neck:   Supple, symmetrical, trachea midline, no adenopathy;    thyroid:  no enlargement/tenderness/nodules; no carotid   bruit or JVD  Back:     Symmetric, no curvature, ROM normal, no CVA tenderness  Lungs:     Clear to auscultation bilaterally, respirations unlabored  Chest Wall:    No tenderness or deformity   Heart:    Regular rate and rhythm, S1 and S2 normal, no murmur, rub or gallop  Breast Exam:    Deferred  Abdomen:     Soft, non-tender, bowel sounds active all four quadrants,    no masses, no organomegaly  Genitalia:    Deferred  Extremities:   Extremities normal, atraumatic, no cyanosis or edema  Pulses:   2+ and symmetric all extremities  Skin:   Skin color, texture, turgor normal, no rashes or lesions  Lymph nodes:   Cervical, supraclavicular, and axillary nodes normal  Neurologic:   CNII-XII intact, normal strength, sensation and reflexes    throughout    Assessment/Plan:   Routine physical examination Today patient counseled on age appropriate routine health concerns for screening and prevention, each reviewed and up to date or declined. Immunizations reviewed and up to date or declined. Labs ordered and reviewed. Risk factors for depression reviewed and negative. Hearing function and visual acuity are intact. ADLs screened and addressed as needed. Functional ability and level of safety reviewed and appropriate. Education, counseling and referrals performed based on assessed risks today. Patient provided with a copy of personalized plan for preventive services.  Chronic hypertension Above goal - no evidence of end organ damage Has been normal at home/work Keep close monitoring of this at home and follow-up if new/worsening Continue 20-25 mg lisinopril-hydrochlorothiazide and nifedipine 30 mg XL daily  Follow-up if new/worsening sx  Encounter for surveillance of contraceptive pills Recommend stopping  COC and switching to POP due to age/HYPERTENSION status in order to reduce reduce stroke or MI Follow-up with gynecology if needs alternative   I,Verona Buck,acting as a scribe for [employer], PA.,have documented all relevant documentation on the behalf of Jarold Motto, PA,as directed by  Jarold Motto, PA while in the presence of Jarold Motto, [place].  I, Jarold Motto, [place], have reviewed all documentation for this visit. The documentation on 02/04/23 for the exam, diagnosis, procedures, and orders are all accurate and complete.   Jarold Motto, PA-C [facility]

## 2023-02-04 NOTE — Patient Instructions (Addendum)
It was great to see you!  Please keep an eye on your blood pressure and message me if elevated  Stop current birth control and start micronor -- follow-up with gynecology if you do not like this  Please go to the lab for blood work.   Our office will call you with your results unless you have chosen to receive results via MyChart.  If your blood work is normal we will follow-up each year for physicals and as scheduled for chronic medical problems.  If anything is abnormal we will treat accordingly and get you in for a follow-up.  Take care,  Lelon Mast

## 2023-02-05 ENCOUNTER — Other Ambulatory Visit (HOSPITAL_COMMUNITY): Payer: Self-pay

## 2023-02-08 ENCOUNTER — Other Ambulatory Visit: Payer: Self-pay | Admitting: Obstetrics

## 2023-02-08 DIAGNOSIS — Z0000 Encounter for general adult medical examination without abnormal findings: Secondary | ICD-10-CM

## 2023-02-09 LAB — CBC WITH DIFFERENTIAL/PLATELET
Basophils Absolute: 0 x10E3/uL (ref 0.0–0.2)
Basos: 1 %
EOS (ABSOLUTE): 0.1 x10E3/uL (ref 0.0–0.4)
Eos: 4 %
Hematocrit: 39 % (ref 34.0–46.6)
Hemoglobin: 13.2 g/dL (ref 11.1–15.9)
Immature Grans (Abs): 0 x10E3/uL (ref 0.0–0.1)
Immature Granulocytes: 0 %
Lymphocytes Absolute: 1.3 x10E3/uL (ref 0.7–3.1)
Lymphs: 37 %
MCH: 28.5 pg (ref 26.6–33.0)
MCHC: 33.8 g/dL (ref 31.5–35.7)
MCV: 84 fL (ref 79–97)
Monocytes Absolute: 0.4 x10E3/uL (ref 0.1–0.9)
Monocytes: 10 %
Neutrophils Absolute: 1.7 x10E3/uL (ref 1.4–7.0)
Neutrophils: 48 %
Platelets: 307 x10E3/uL (ref 150–450)
RBC: 4.63 x10E6/uL (ref 3.77–5.28)
RDW: 11.8 % (ref 11.7–15.4)
WBC: 3.5 x10E3/uL (ref 3.4–10.8)

## 2023-02-09 LAB — COMPREHENSIVE METABOLIC PANEL
ALT: 9 IU/L (ref 0–32)
AST: 13 IU/L (ref 0–40)
Albumin/Globulin Ratio: 1.7 (ref 1.2–2.2)
Albumin: 4.3 g/dL (ref 3.9–4.9)
Alkaline Phosphatase: 52 IU/L (ref 44–121)
BUN/Creatinine Ratio: 12 (ref 9–23)
BUN: 10 mg/dL (ref 6–20)
Bilirubin Total: 0.3 mg/dL (ref 0.0–1.2)
CO2: 21 mmol/L (ref 20–29)
Calcium: 9.3 mg/dL (ref 8.7–10.2)
Chloride: 103 mmol/L (ref 96–106)
Creatinine, Ser: 0.83 mg/dL (ref 0.57–1.00)
Globulin, Total: 2.5 g/dL (ref 1.5–4.5)
Glucose: 88 mg/dL (ref 70–99)
Potassium: 3.8 mmol/L (ref 3.5–5.2)
Sodium: 143 mmol/L (ref 134–144)
Total Protein: 6.8 g/dL (ref 6.0–8.5)
eGFR: 94 mL/min/{1.73_m2} (ref >59)

## 2023-02-09 LAB — LIPID PANEL
Chol/HDL Ratio: 3.8 ratio (ref 0.0–4.4)
Cholesterol, Total: 197 mg/dL (ref 100–199)
HDL: 52 mg/dL (ref >39)
LDL Chol Calc (NIH): 126 mg/dL — ABNORMAL HIGH (ref 0–99)
Triglycerides: 108 mg/dL (ref 0–149)
VLDL Cholesterol Cal: 19 mg/dL (ref 5–40)

## 2023-02-09 LAB — BETA HCG QUANT (REF LAB): hCG Quant: <1 m[IU]/mL

## 2023-02-12 ENCOUNTER — Other Ambulatory Visit: Payer: Self-pay | Admitting: Physician Assistant

## 2023-02-14 ENCOUNTER — Other Ambulatory Visit (HOSPITAL_COMMUNITY): Payer: Self-pay

## 2023-02-14 MED ORDER — NIFEDIPINE ER OSMOTIC RELEASE 30 MG PO TB24
30.0000 mg | ORAL_TABLET | Freq: Every day | ORAL | 0 refills | Status: DC
  Filled 2023-02-14: qty 30, 30d supply, fill #0

## 2023-02-15 NOTE — Telephone Encounter (Signed)
Chelsea Middleton, pt had CPE labs done at another office and another provider has results in. Please see labs from 5/7.

## 2023-02-17 ENCOUNTER — Other Ambulatory Visit (HOSPITAL_COMMUNITY): Payer: Self-pay

## 2023-02-22 ENCOUNTER — Encounter: Payer: Self-pay | Admitting: Physician Assistant

## 2023-02-22 LAB — RPR: RPR Screen: NONREACTIVE

## 2023-02-22 LAB — GC/CHLAMYDIA PROBE AMP: GC/Chlamydia probe amp (CH): NEGATIVE

## 2023-03-14 ENCOUNTER — Other Ambulatory Visit: Payer: Self-pay | Admitting: Physician Assistant

## 2023-03-14 ENCOUNTER — Other Ambulatory Visit (HOSPITAL_COMMUNITY): Payer: Self-pay

## 2023-03-14 MED ORDER — NIFEDIPINE ER OSMOTIC RELEASE 30 MG PO TB24
30.0000 mg | ORAL_TABLET | Freq: Every day | ORAL | 2 refills | Status: DC
  Filled 2023-03-14: qty 30, 30d supply, fill #0
  Filled 2023-04-16: qty 30, 30d supply, fill #1
  Filled 2023-05-15: qty 30, 30d supply, fill #2

## 2023-03-18 ENCOUNTER — Other Ambulatory Visit: Payer: Self-pay | Admitting: Obstetrics

## 2023-03-18 ENCOUNTER — Other Ambulatory Visit (HOSPITAL_COMMUNITY): Payer: Self-pay

## 2023-03-18 ENCOUNTER — Other Ambulatory Visit: Payer: Self-pay

## 2023-03-18 DIAGNOSIS — B9689 Other specified bacterial agents as the cause of diseases classified elsewhere: Secondary | ICD-10-CM

## 2023-03-18 DIAGNOSIS — B379 Candidiasis, unspecified: Secondary | ICD-10-CM

## 2023-03-18 MED ORDER — METRONIDAZOLE 500 MG PO TABS
500.0000 mg | ORAL_TABLET | Freq: Two times a day (BID) | ORAL | 2 refills | Status: DC
Start: 2023-03-18 — End: 2023-10-24
  Filled 2023-03-18: qty 14, 7d supply, fill #0
  Filled 2023-04-16: qty 14, 7d supply, fill #1
  Filled 2023-05-15: qty 14, 7d supply, fill #2

## 2023-03-18 MED ORDER — FLUCONAZOLE 150 MG PO TABS
150.0000 mg | ORAL_TABLET | Freq: Once | ORAL | 0 refills | Status: DC
Start: 2023-03-18 — End: 2023-03-22
  Filled 2023-03-18: qty 1, 1d supply, fill #0

## 2023-03-21 ENCOUNTER — Other Ambulatory Visit (HOSPITAL_COMMUNITY): Payer: Self-pay

## 2023-05-03 ENCOUNTER — Other Ambulatory Visit (HOSPITAL_COMMUNITY): Payer: Self-pay

## 2023-05-05 ENCOUNTER — Other Ambulatory Visit (HOSPITAL_COMMUNITY): Payer: Self-pay

## 2023-05-20 DIAGNOSIS — L102 Pemphigus foliaceous: Secondary | ICD-10-CM | POA: Diagnosis not present

## 2023-05-24 ENCOUNTER — Other Ambulatory Visit (HOSPITAL_COMMUNITY): Payer: Self-pay

## 2023-05-24 MED ORDER — AMOXICILLIN 500 MG PO CAPS
500.0000 mg | ORAL_CAPSULE | Freq: Three times a day (TID) | ORAL | 0 refills | Status: DC
  Filled 2023-05-24: qty 15, 5d supply, fill #0

## 2023-06-03 DIAGNOSIS — L102 Pemphigus foliaceous: Secondary | ICD-10-CM | POA: Diagnosis not present

## 2023-06-11 ENCOUNTER — Other Ambulatory Visit: Payer: Self-pay | Admitting: Physician Assistant

## 2023-06-13 ENCOUNTER — Other Ambulatory Visit (HOSPITAL_COMMUNITY): Payer: Self-pay

## 2023-06-13 MED ORDER — NIFEDIPINE ER OSMOTIC RELEASE 30 MG PO TB24
30.0000 mg | ORAL_TABLET | Freq: Every day | ORAL | 2 refills | Status: DC
  Filled 2023-06-13: qty 30, 30d supply, fill #0
  Filled 2023-07-14 – 2023-07-15 (×2): qty 30, 30d supply, fill #1
  Filled 2023-08-16: qty 30, 30d supply, fill #2

## 2023-06-14 ENCOUNTER — Other Ambulatory Visit (HOSPITAL_COMMUNITY): Payer: Self-pay

## 2023-07-14 ENCOUNTER — Other Ambulatory Visit: Payer: Self-pay

## 2023-07-15 ENCOUNTER — Other Ambulatory Visit (HOSPITAL_COMMUNITY): Payer: Self-pay

## 2023-08-01 ENCOUNTER — Other Ambulatory Visit (HOSPITAL_COMMUNITY): Payer: Self-pay

## 2023-08-01 DIAGNOSIS — Z79899 Other long term (current) drug therapy: Secondary | ICD-10-CM | POA: Diagnosis not present

## 2023-08-01 DIAGNOSIS — L102 Pemphigus foliaceous: Secondary | ICD-10-CM | POA: Diagnosis not present

## 2023-08-01 MED ORDER — FLUOCINOLONE ACETONIDE SCALP 0.01 % EX OIL
TOPICAL_OIL | Freq: Every day | CUTANEOUS | 3 refills | Status: DC
  Filled 2023-08-01 – 2023-08-16 (×2): qty 118.28, 30d supply, fill #0

## 2023-08-12 ENCOUNTER — Other Ambulatory Visit (HOSPITAL_COMMUNITY): Payer: Self-pay

## 2023-08-16 ENCOUNTER — Other Ambulatory Visit (HOSPITAL_COMMUNITY): Payer: Self-pay

## 2023-08-19 ENCOUNTER — Other Ambulatory Visit (HOSPITAL_COMMUNITY): Payer: Self-pay

## 2023-09-11 ENCOUNTER — Other Ambulatory Visit: Payer: Self-pay | Admitting: Physician Assistant

## 2023-09-12 ENCOUNTER — Other Ambulatory Visit (HOSPITAL_COMMUNITY): Payer: Self-pay

## 2023-09-12 MED ORDER — NIFEDIPINE ER OSMOTIC RELEASE 30 MG PO TB24
30.0000 mg | ORAL_TABLET | Freq: Every day | ORAL | 2 refills | Status: DC
  Filled 2023-09-12: qty 30, 30d supply, fill #0
  Filled 2023-10-13: qty 30, 30d supply, fill #1
  Filled 2023-11-18: qty 30, 30d supply, fill #2

## 2023-09-16 ENCOUNTER — Other Ambulatory Visit (HOSPITAL_COMMUNITY): Payer: Self-pay

## 2023-10-05 DIAGNOSIS — S92911A Unspecified fracture of right toe(s), initial encounter for closed fracture: Secondary | ICD-10-CM

## 2023-10-05 HISTORY — DX: Unspecified fracture of right toe(s), initial encounter for closed fracture: S92.911A

## 2023-10-11 ENCOUNTER — Other Ambulatory Visit (HOSPITAL_COMMUNITY): Payer: Self-pay

## 2023-10-11 DIAGNOSIS — Z124 Encounter for screening for malignant neoplasm of cervix: Secondary | ICD-10-CM | POA: Diagnosis not present

## 2023-10-11 DIAGNOSIS — Z01419 Encounter for gynecological examination (general) (routine) without abnormal findings: Secondary | ICD-10-CM | POA: Diagnosis not present

## 2023-10-11 DIAGNOSIS — Z113 Encounter for screening for infections with a predominantly sexual mode of transmission: Secondary | ICD-10-CM | POA: Diagnosis not present

## 2023-10-11 DIAGNOSIS — Z01411 Encounter for gynecological examination (general) (routine) with abnormal findings: Secondary | ICD-10-CM | POA: Diagnosis not present

## 2023-10-11 MED ORDER — FLUCONAZOLE 150 MG PO TABS
150.0000 mg | ORAL_TABLET | ORAL | 0 refills | Status: DC
  Filled 2023-10-11: qty 3, 7d supply, fill #0

## 2023-10-11 MED ORDER — NORETHIN ACE-ETH ESTRAD-FE 1-20 MG-MCG PO TABS
1.0000 | ORAL_TABLET | Freq: Every day | ORAL | 3 refills | Status: DC
  Filled 2023-10-11: qty 84, 84d supply, fill #0

## 2023-10-11 MED ORDER — METRONIDAZOLE 500 MG PO TABS
500.0000 mg | ORAL_TABLET | Freq: Two times a day (BID) | ORAL | 0 refills | Status: DC
  Filled 2023-10-11: qty 14, 7d supply, fill #0

## 2023-10-20 ENCOUNTER — Other Ambulatory Visit (HOSPITAL_COMMUNITY): Payer: Self-pay

## 2023-10-24 ENCOUNTER — Encounter: Payer: Self-pay | Admitting: Emergency Medicine

## 2023-10-24 ENCOUNTER — Ambulatory Visit (INDEPENDENT_AMBULATORY_CARE_PROVIDER_SITE_OTHER): Payer: Commercial Managed Care - PPO

## 2023-10-24 ENCOUNTER — Ambulatory Visit: Payer: Commercial Managed Care - PPO

## 2023-10-24 ENCOUNTER — Ambulatory Visit
Admission: EM | Admit: 2023-10-24 | Discharge: 2023-10-24 | Disposition: A | Discharge status: Home / Self Care | Payer: Commercial Managed Care - PPO | Attending: Internal Medicine | Admitting: Internal Medicine

## 2023-10-24 DIAGNOSIS — M359 Systemic involvement of connective tissue, unspecified: Secondary | ICD-10-CM | POA: Insufficient documentation

## 2023-10-24 DIAGNOSIS — S92524A Nondisplaced fracture of medial phalanx of right lesser toe(s), initial encounter for closed fracture: Secondary | ICD-10-CM | POA: Diagnosis not present

## 2023-10-24 DIAGNOSIS — S92531K Displaced fracture of distal phalanx of right lesser toe(s), subsequent encounter for fracture with nonunion: Secondary | ICD-10-CM

## 2023-10-24 MED ORDER — HYDROCODONE-ACETAMINOPHEN 5-325 MG PO TABS
1.0000 | ORAL_TABLET | Freq: Four times a day (QID) | ORAL | 0 refills | Status: DC | PRN
  Filled 2023-10-25: qty 10, 3d supply, fill #0

## 2023-10-24 NOTE — ED Triage Notes (Signed)
Pt reports stubbing R pinkie toe on coffee table earlier this evening. Initially, her pinkie toe was displaced but in her panic she reset pinkie toe to straight position. Pt reports initial dull pain to toe, but it has since went numb. Pt has limited ROM in the toe and swelling to the area. Toe is tender to touch.

## 2023-10-24 NOTE — Discharge Instructions (Signed)
Follow up with Orthopedics this week

## 2023-10-24 NOTE — ED Provider Notes (Signed)
[facility]    CSN: [id number] Arrival date & time: 10/24/23  1819      History   Chief Complaint Chief Complaint  Patient presents with   Toe Pain    HPI Chelsea Middleton is a 38 y.o. female who tripped on a cord and thinks she hit her R small toe on the corner of a talble and when she looked her R small toe was deviated to the side and she placed it back. This happed around 5 pm today.     Past Medical History:  Diagnosis Date   Asthma    HSV (herpes simplex virus) infection    Hypertension    Pemphigus foliaceous     Patient Active Problem List   Diagnosis Date Noted   Connective tissue disease (HCC) 10/24/2023   Essential hypertension 07/25/2021   Encounter for induction of labor 07/24/2021   VBAC, delivered 07/24/2021   Postpartum care following VBAC - 10/21 07/24/2021   Perineal laceration with delivery, first degree and right periurethral 07/24/2021   High risk pregnancy in young primigravida 07/21/2015   Hypertension affecting pregnancy in third trimester 07/09/2015   Pemphigus foliaceus 09/10/2011    Past Surgical History:  Procedure Laterality Date   CESAREAN SECTION     HERNIA REPAIR     TONSILLECTOMY      OB History     Gravida  4   Para  2   Term  2   Preterm      AB  2   Living  2      SAB      IAB  2   Ectopic      Multiple  0   Live Births  2            Home Medications    Prior to Admission medications   Medication Sig Start Date End Date Taking? Authorizing Provider  HYDROcodone-acetaminophen (NORCO/VICODIN) 5-325 MG tablet Take 1 tablet by mouth every 6 (six) hours as needed. 10/24/23  Yes [name], [name], PA-C  ibuprofen (ADVIL) 600 MG tablet Take 1 tablet (600 mg total) by mouth every 6 (six) hours. 07/26/21  Yes June Leap, CNM  lisinopril-hydrochlorothiazide (ZESTORETIC) 20-25 MG tablet 1 tablet Orally Once a day for 30 day(s) 01/22/22  Yes [provider]   NIFEdipine (PROCARDIA-XL/NIFEDICAL-XL) 30 MG 24 hr tablet Take 1 tablet (30 mg total) by mouth daily. 09/12/23  Yes Jarold Motto, PA  lisinopril-hydrochlorothiazide (ZESTORETIC) 20-25 MG tablet Take 1 tablet by mouth daily. 02/04/23   Jarold Motto, PA    Family History Family History  Problem Relation Age of Onset   Hypertension Mother    Asthma Sister    Breast cancer Other    Colon cancer Neg Hx     Social History Social History   Tobacco Use   Smoking status: Never   Smokeless tobacco: Never  Vaping Use   Vaping status: Never Used  Substance Use Topics   Alcohol use: Yes    Comment: Occasionally   Drug use: No     Allergies   Olmesartan-amlodipine-hctz   Review of Systems Review of Systems As noted in HPI  Physical Exam Triage Vital Signs ED Triage Vitals  Encounter Vitals Group     BP 10/24/23 1943 (!) 151/93     Systolic BP Percentile --      Diastolic BP Percentile --      Pulse Rate 10/24/23 1943 88     Resp 10/24/23  1943 16     Temp 10/24/23 1943 98.1 F (36.7 C)     Temp Source 10/24/23 1943 Oral     SpO2 10/24/23 1943 99 %     Weight --      Height --      Head Circumference --      Peak Flow --      Pain Score 10/24/23 1944 5     Pain Loc --      Pain Education --      Exclude from Growth Chart --    No data found.  Updated Vital Signs BP (!) 151/93 (BP Location: Left Arm)   Pulse 88   Temp 98.1 F (36.7 C) (Oral)   Resp 16   LMP 10/19/2023 (Approximate)   SpO2 99%   Visual Acuity Right Eye Distance:   Left Eye Distance:   Bilateral Distance:    Right Eye Near:   Left Eye Near:    Bilateral Near:     Physical Exam Vitals and nursing note reviewed.  Constitutional:      General: She is not in acute distress.    Appearance: She is normal weight. She is not toxic-appearing.  HENT:     Right Ear: External ear normal.     Left Ear: External ear normal.  Eyes:     General: No scleral icterus.    Conjunctiva/sclera:  Conjunctivae normal.  Pulmonary:     Effort: Pulmonary effort is normal.  Musculoskeletal:     Cervical back: Neck supple.     Comments: R FOOT- there is no deformity of her 5th toe, but is very tender on mid mid area and distally. No wounds or ecchymosis present at this time.   Neurological:     Mental Status: She is alert and oriented to person, place, and time.     Gait: Gait abnormal.     Comments: Has slight limp  Psychiatric:        Mood and Affect: Mood normal.        Behavior: Behavior normal.        Thought Content: Thought content normal.        Judgment: Judgment normal.      UC Treatments / Results  Labs (all labs ordered are listed, but only abnormal results are displayed) Labs Reviewed - No data to display  EKG   Radiology DG Toe 5th Right Result Date: 10/24/2023 CLINICAL DATA:  Trauma to toe, pain EXAM: RIGHT FIFTH TOE COMPARISON:  None Available. FINDINGS: There is a fracture through the middle phalanx of the right little toe. This is nondisplaced. No subluxation or dislocation. Overlying soft tissue swelling. IMPRESSION: Nondisplaced fracture through the middle phalanx of the right little toe. Electronically Signed   By: Charlett Nose M.D.   On: 10/24/2023 20:21    Procedures Procedures (including critical care time)  Medications Ordered in UC Medications - No data to display  Initial Impression / Assessment and Plan / UC Course  I have reviewed the triage vital signs and the nursing notes.  Pertinent  imaging results that were available during my care of the patient were reviewed by me and considered in my medical decision making (see chart for details).   Fracture of R 5th distal toe  Her 4th and  5th toes were buddy tapped together She was placed on Norco as noted for pain Needs to FU with ortho this week.   Final Clinical Impressions(s) / UC Diagnoses  Final diagnoses:  Closed displaced fracture of distal phalanx of lesser toe of right foot  with nonunion     Discharge Instructions      Follow up with Orthopedics this week    ED Prescriptions     Medication Sig Dispense Auth. Provider   HYDROcodone-acetaminophen (NORCO/VICODIN) 5-325 MG tablet Take 1 tablet by mouth every 6 (six) hours as needed. 10 tablet [name], [name], PA-C      I have reviewed the PDMP during this encounter.   Garey Ham, [place] 10/25/23 1610

## 2023-10-25 ENCOUNTER — Other Ambulatory Visit (HOSPITAL_COMMUNITY): Payer: Self-pay

## 2023-10-26 DIAGNOSIS — S93104A Unspecified dislocation of right toe(s), initial encounter: Secondary | ICD-10-CM | POA: Diagnosis not present

## 2023-11-21 DIAGNOSIS — L109 Pemphigus, unspecified: Secondary | ICD-10-CM | POA: Diagnosis not present

## 2023-12-05 DIAGNOSIS — L109 Pemphigus, unspecified: Secondary | ICD-10-CM | POA: Diagnosis not present

## 2023-12-14 ENCOUNTER — Other Ambulatory Visit (HOSPITAL_COMMUNITY): Payer: Self-pay

## 2023-12-14 ENCOUNTER — Other Ambulatory Visit: Payer: Self-pay

## 2023-12-14 ENCOUNTER — Other Ambulatory Visit: Payer: Self-pay | Admitting: Physician Assistant

## 2023-12-14 MED ORDER — NIFEDIPINE ER OSMOTIC RELEASE 30 MG PO TB24
30.0000 mg | ORAL_TABLET | Freq: Every day | ORAL | 2 refills | Status: DC
  Filled 2023-12-14: qty 30, 30d supply, fill #0
  Filled 2024-01-13: qty 30, 30d supply, fill #1
  Filled 2024-02-21: qty 30, 30d supply, fill #2

## 2023-12-23 DIAGNOSIS — M79674 Pain in right toe(s): Secondary | ICD-10-CM | POA: Diagnosis not present

## 2024-01-13 ENCOUNTER — Other Ambulatory Visit (HOSPITAL_COMMUNITY): Payer: Self-pay

## 2024-01-13 ENCOUNTER — Other Ambulatory Visit: Payer: Self-pay

## 2024-01-13 ENCOUNTER — Encounter: Payer: Self-pay | Admitting: Pharmacist

## 2024-01-18 ENCOUNTER — Other Ambulatory Visit: Payer: Self-pay

## 2024-02-06 ENCOUNTER — Other Ambulatory Visit (HOSPITAL_COMMUNITY): Payer: Self-pay

## 2024-02-06 ENCOUNTER — Encounter: Payer: Self-pay | Admitting: Physician Assistant

## 2024-02-06 ENCOUNTER — Ambulatory Visit (INDEPENDENT_AMBULATORY_CARE_PROVIDER_SITE_OTHER): Admitting: Physician Assistant

## 2024-02-06 VITALS — BP 130/80 | HR 85 | Temp 98.2°F | Ht 62.0 in | Wt 136.5 lb

## 2024-02-06 DIAGNOSIS — E785 Hyperlipidemia, unspecified: Secondary | ICD-10-CM | POA: Diagnosis not present

## 2024-02-06 DIAGNOSIS — Z0001 Encounter for general adult medical examination with abnormal findings: Secondary | ICD-10-CM | POA: Diagnosis not present

## 2024-02-06 DIAGNOSIS — L109 Pemphigus, unspecified: Secondary | ICD-10-CM | POA: Diagnosis not present

## 2024-02-06 DIAGNOSIS — I10 Essential (primary) hypertension: Secondary | ICD-10-CM

## 2024-02-06 DIAGNOSIS — H938X2 Other specified disorders of left ear: Secondary | ICD-10-CM | POA: Diagnosis not present

## 2024-02-06 DIAGNOSIS — Z3041 Encounter for surveillance of contraceptive pills: Secondary | ICD-10-CM

## 2024-02-06 MED ORDER — LISINOPRIL-HYDROCHLOROTHIAZIDE 20-25 MG PO TABS
1.0000 | ORAL_TABLET | Freq: Every day | ORAL | 3 refills | Status: AC
  Filled 2024-02-06 – 2024-03-16 (×2): qty 90, 90d supply, fill #0
  Filled 2024-06-16: qty 90, 90d supply, fill #1
  Filled 2024-09-17: qty 90, 90d supply, fill #2

## 2024-02-06 MED ORDER — SLYND 4 MG PO TABS
1.0000 | ORAL_TABLET | Freq: Every day | ORAL | 3 refills | Status: AC
Start: 2024-02-06 — End: ?
  Filled 2024-02-06: qty 84, 84d supply, fill #0

## 2024-02-06 NOTE — Patient Instructions (Signed)
 It was great to see you!  Please go to the lab for blood work.   Our office will call you with your results unless you have chosen to receive results via MyChart.  If your blood work is normal we will follow-up each year for physicals and as scheduled for chronic medical problems.  If anything is abnormal we will treat accordingly and get you in for a follow-up.  Take care,  Chelsea Middleton

## 2024-02-06 NOTE — Progress Notes (Signed)
 Subjective:    Chelsea Middleton is a 38 y.o. female and is here for a comprehensive physical exam.  HPI  There are no preventive care reminders to display for this patient.   Acute Concerns: Left ear pressure: Pt complains of intermittent left ear pressure, starting a few days ago. She states she can "feel it closing and opening." Pt reports these episodes are random and lasts seconds to minutes. Denies pain, using AirPods regularly, or Qtip use. Pt would like ear wax taken out today.  Birth control: Pt is on Slynd once daily. Pt notes break through bleeding while taking norethindrone  (was on Camila  and orthopedic Micronor ) Pt would like to continue on Slynd.  Chronic Issues: Hypertension Pt is on Lisinopril -HCTZ 20-25 mg once daily and Nifedipine  30 mg once daily. Good compliance and tolerance. Pt would like refills.  Health Maintenance: Immunizations -- UTD. Colonoscopy -- N/a Mammogram -- N/a PAP -- Last done 09/17/2022, results were NILM. 4 year repeat recommended, next due 09/18/2027. Bone Density -- N/a Diet -- Unhealthy diet and sugar cravings. Would like to check her A1c. Exercise -- Sedentary lifestyle.  Sleep habits -- No acute concerns reported today. Mood -- Stable.  UTD with dentist? - Not UTD; needs to see one soon. UTD with eye doctor? - Not UTD; needs to see one soon.  Weight history: Wt Readings from Last 10 Encounters:  02/06/24 136 lb 8 oz (61.9 kg)  02/04/23 136 lb 4 oz (61.8 kg)  03/23/22 139 lb 6 oz (63.2 kg)  01/28/21 151 lb 8 oz (68.7 kg)  03/30/14 125 lb (56.7 kg)   Body mass index is 24.97 kg/m. Patient's last menstrual period was 02/02/2024 (exact date).  Alcohol use:  reports current alcohol use.  Tobacco use:  Tobacco Use: Low Risk  (02/06/2024)   Patient History    Smoking Tobacco Use: Never    Smokeless Tobacco Use: Never    Passive Exposure: Not on file   Eligible for lung cancer screening? no     02/06/2024     2:01 PM  Depression screen PHQ 2/9  Decreased Interest 0  Down, Depressed, Hopeless 0  PHQ - 2 Score 0     Other providers/specialists: Patient Care Team: Alexander Iba, [place] as PCP - General (Physician Assistant)    PMHx, SurgHx, SocialHx, Medications, and Allergies were reviewed in the Visit Navigator and updated as appropriate.   Past Medical History:  Diagnosis Date   Asthma    HSV (herpes simplex virus) infection    Hypertension    Pemphigus foliaceous    Toe fracture, right 10/2023   pinky toe     Past Surgical History:  Procedure Laterality Date   CESAREAN SECTION     HERNIA REPAIR     TONSILLECTOMY       Family History  Problem Relation Age of Onset   Hypertension Mother    Depression Mother    CVA Father    Asthma Sister    Breast cancer Other    Colon cancer Neg Hx     Social History   Tobacco Use   Smoking status: Never   Smokeless tobacco: Never  Vaping Use   Vaping status: Never Used  Substance Use Topics   Alcohol use: Yes    Comment: Occasionally   Drug use: No    Review of Systems:   Review of Systems  Constitutional:  Negative for chills, fever, malaise/fatigue and weight loss.  HENT:  Negative for hearing  loss, sinus pain and sore throat.   Respiratory:  Negative for cough and hemoptysis.   Cardiovascular:  Negative for chest pain, palpitations, leg swelling and PND.  Gastrointestinal:  Negative for abdominal pain, constipation, diarrhea, heartburn, nausea and vomiting.  Genitourinary:  Negative for dysuria, frequency and urgency.  Musculoskeletal:  Negative for back pain, myalgias and neck pain.  Skin:  Negative for itching and rash.  Neurological:  Negative for dizziness, tingling, seizures and headaches.  Endo/Heme/Allergies:  Negative for polydipsia.  Psychiatric/Behavioral:  Negative for depression. The patient is not nervous/anxious.      Objective:   BP 130/80 (BP Location: Left Arm, Patient Position: Sitting, Cuff  Size: Normal)   Pulse 85   Temp 98.2 F (36.8 C) (Temporal)   Ht 5\' 2"  (1.575 m)   Wt 136 lb 8 oz (61.9 kg)   LMP 02/02/2024 (Exact Date)   SpO2 99%   BMI 24.97 kg/m  Body mass index is 24.97 kg/m.   General Appearance:    Alert, cooperative, no distress, appears stated age  Head:    Normocephalic, without obvious abnormality, atraumatic  Eyes:    PERRL, conjunctiva/corneas clear, EOM's intact, fundi    benign, both eyes  Ears:    Normal TM's and external ear canals, both ears Left TM with cerumen obstructing view  Nose:   Nares normal, septum midline, mucosa normal, no drainage    or sinus tenderness  Throat:   Lips, mucosa, and tongue normal; teeth and gums normal  Neck:   Supple, symmetrical, trachea midline, no adenopathy;    thyroid:  no enlargement/tenderness/nodules; no carotid   bruit or JVD  Back:     Symmetric, no curvature, ROM normal, no CVA tenderness  Lungs:     Clear to auscultation bilaterally, respirations unlabored  Chest Wall:    No tenderness or deformity   Heart:    Regular rate and rhythm, S1 and S2 normal, no murmur, rub or gallop  Breast Exam:    Deferred  Abdomen:     Soft, non-tender, bowel sounds active all four quadrants,    no masses, no organomegaly  Genitalia:    Deferred  Extremities:   Extremities normal, atraumatic, no cyanosis or edema  Pulses:   2+ and symmetric all extremities  Skin:   Skin color, texture, turgor normal, no rashes or lesions  Lymph nodes:   Cervical, supraclavicular, and axillary nodes normal  Neurologic:   CNII-XII intact, normal strength, sensation and reflexes    throughout   Ceruminosis is noted.  Wax is removed by syringing and manual debridement.    Assessment/Plan:   Encounter for general adult medical examination with abnormal findings Today patient counseled on age appropriate routine health concerns for screening and prevention, each reviewed and up to date or declined. Immunizations reviewed and up to date  or declined. Labs ordered and reviewed. Risk factors for depression reviewed and negative. Hearing function and visual acuity are intact. ADLs screened and addressed as needed. Functional ability and level of safety reviewed and appropriate. Education, counseling and referrals performed based on assessed risks today. Patient provided with a copy of personalized plan for preventive services.  Sensation of fullness in left ear Tolerated ear lavage well Continue to monitor symptoms and refer to ENT if symptoms return or worsen  Pemphigus Management per dermatology  HTN (hypertension), benign Well-controlled We discussed changing to 1 agent and maximizing dose to see if we could decrease pill burden but she is not interested in  this at this time Continue lisinopril -HCTZ 20-25 mg once daily and Nifedipine  30 mg once daily.  Hyperlipidemia, unspecified hyperlipidemia type Update lipid panel and provide recommendations accordingly  Contraceptives Will provide prescription for Slynd She could not tolerate Camila  or OrthoMicronor due to breakthrough bleeding and she cannot take COC's due to elevated blood pressure   I, Timoteo Force, acting as a Neurosurgeon for [employer], [place]., have documented all relevant documentation on the behalf of Alexander Iba, [place], as directed by  Alexander Iba, PA while in the presence of Alexander Iba, [place].  I, Alexander Iba, [place], have reviewed all documentation for this visit. The documentation on 02/06/24 for the exam, diagnosis, procedures, and orders are all accurate and complete.  Alexander Iba, PA-C [facility] [place] [facility]

## 2024-02-07 ENCOUNTER — Encounter: Payer: Self-pay | Admitting: Physician Assistant

## 2024-02-07 ENCOUNTER — Other Ambulatory Visit (HOSPITAL_COMMUNITY): Payer: Self-pay

## 2024-02-07 LAB — COMPREHENSIVE METABOLIC PANEL WITH GFR
ALT: 20 U/L (ref 0–35)
AST: 27 U/L (ref 0–37)
Albumin: 4.4 g/dL (ref 3.5–5.2)
Alkaline Phosphatase: 43 U/L (ref 39–117)
BUN: 14 mg/dL (ref 6–23)
CO2: 29 meq/L (ref 19–32)
Calcium: 9.2 mg/dL (ref 8.4–10.5)
Chloride: 98 meq/L (ref 96–112)
Creatinine, Ser: 0.78 mg/dL (ref 0.40–1.20)
GFR: 96.99 mL/min (ref >60.00)
Glucose, Bld: 73 mg/dL (ref 70–99)
Potassium: 3.4 meq/L — ABNORMAL LOW (ref 3.5–5.1)
Sodium: 136 meq/L (ref 135–145)
Total Bilirubin: 0.4 mg/dL (ref 0.2–1.2)
Total Protein: 7 g/dL (ref 6.0–8.3)

## 2024-02-07 LAB — CBC WITH DIFFERENTIAL/PLATELET
Basophils Absolute: 0 10*3/uL (ref 0.0–0.1)
Basophils Relative: 1.3 % (ref 0.0–3.0)
Eosinophils Absolute: 0.1 10*3/uL (ref 0.0–0.7)
Eosinophils Relative: 1.9 % (ref 0.0–5.0)
HCT: 39.7 % (ref 36.0–46.0)
Hemoglobin: 13.9 g/dL (ref 12.0–15.0)
Lymphocytes Relative: 45.4 % (ref 12.0–46.0)
Lymphs Abs: 1.6 10*3/uL (ref 0.7–4.0)
MCHC: 35 g/dL (ref 30.0–36.0)
MCV: 83.7 fl (ref 78.0–100.0)
Monocytes Absolute: 0.4 10*3/uL (ref 0.1–1.0)
Monocytes Relative: 12.4 % — ABNORMAL HIGH (ref 3.0–12.0)
Neutro Abs: 1.4 10*3/uL (ref 1.4–7.7)
Neutrophils Relative %: 39 % — ABNORMAL LOW (ref 43.0–77.0)
Platelets: 286 10*3/uL (ref 150.0–400.0)
RBC: 4.75 Mil/uL (ref 3.87–5.11)
RDW: 12 % (ref 11.5–15.5)
WBC: 3.6 10*3/uL — ABNORMAL LOW (ref 4.0–10.5)

## 2024-02-07 LAB — LIPID PANEL
Cholesterol: 167 mg/dL (ref 0–200)
HDL: 34.4 mg/dL — ABNORMAL LOW (ref >39.00)
LDL Cholesterol: 111 mg/dL — ABNORMAL HIGH (ref 0–99)
NonHDL: 132.98
Total CHOL/HDL Ratio: 5
Triglycerides: 112 mg/dL (ref 0.0–149.0)
VLDL: 22.4 mg/dL (ref 0.0–40.0)

## 2024-02-07 LAB — HEMOGLOBIN A1C: Hgb A1c MFr Bld: 5.4 % (ref 4.6–6.5)

## 2024-02-08 ENCOUNTER — Other Ambulatory Visit (HOSPITAL_COMMUNITY): Payer: Self-pay

## 2024-02-08 ENCOUNTER — Other Ambulatory Visit: Payer: Self-pay

## 2024-02-08 DIAGNOSIS — B379 Candidiasis, unspecified: Secondary | ICD-10-CM

## 2024-02-08 MED ORDER — FLUCONAZOLE 150 MG PO TABS
ORAL_TABLET | ORAL | 3 refills | Status: DC
  Filled 2024-02-08: qty 1, 1d supply, fill #0
  Filled 2024-03-16: qty 1, 1d supply, fill #1
  Filled 2024-04-17: qty 1, 1d supply, fill #2
  Filled 2024-05-20: qty 1, 1d supply, fill #3

## 2024-02-08 NOTE — Progress Notes (Signed)
Rx sent to Pharmacy

## 2024-02-09 ENCOUNTER — Other Ambulatory Visit (HOSPITAL_COMMUNITY): Payer: Self-pay

## 2024-03-16 ENCOUNTER — Other Ambulatory Visit (HOSPITAL_COMMUNITY): Payer: Self-pay

## 2024-03-16 ENCOUNTER — Other Ambulatory Visit: Payer: Self-pay | Admitting: Physician Assistant

## 2024-03-16 ENCOUNTER — Other Ambulatory Visit: Payer: Self-pay

## 2024-03-16 MED ORDER — NIFEDIPINE ER OSMOTIC RELEASE 30 MG PO TB24
30.0000 mg | ORAL_TABLET | Freq: Every day | ORAL | 2 refills | Status: DC
  Filled 2024-03-16: qty 30, 30d supply, fill #0
  Filled 2024-04-17: qty 30, 30d supply, fill #1
  Filled 2024-05-20: qty 30, 30d supply, fill #2

## 2024-03-19 ENCOUNTER — Other Ambulatory Visit (HOSPITAL_COMMUNITY): Payer: Self-pay

## 2024-03-19 ENCOUNTER — Encounter: Payer: Self-pay | Admitting: Physician Assistant

## 2024-03-19 MED ORDER — NORETHINDRONE 0.35 MG PO TABS
1.0000 | ORAL_TABLET | Freq: Every day | ORAL | 1 refills | Status: DC
  Filled 2024-03-19: qty 84, 84d supply, fill #0
  Filled 2024-06-10: qty 84, 84d supply, fill #1

## 2024-03-19 NOTE — Telephone Encounter (Signed)
 Chelsea Middleton, which pill is she suppose to go back on? Ortho Micronor  0.35 mg or LoEstrin  Fe 1/20 mg.

## 2024-03-19 NOTE — Telephone Encounter (Signed)
 Please see message and advise if okay to change OC's.

## 2024-03-23 ENCOUNTER — Other Ambulatory Visit (HOSPITAL_COMMUNITY): Payer: Self-pay

## 2024-05-07 DIAGNOSIS — L109 Pemphigus, unspecified: Secondary | ICD-10-CM | POA: Diagnosis not present

## 2024-05-07 DIAGNOSIS — L102 Pemphigus foliaceous: Secondary | ICD-10-CM | POA: Diagnosis not present

## 2024-05-15 ENCOUNTER — Other Ambulatory Visit (HOSPITAL_COMMUNITY): Payer: Self-pay

## 2024-05-15 MED ORDER — XACIATO 2 % VA GEL
1.0000 | VAGINAL | 0 refills | Status: AC
  Filled 2024-05-15 – 2024-05-17 (×3): qty 8, 1d supply, fill #0
  Filled 2024-05-21: qty 8, 30d supply, fill #0

## 2024-05-17 ENCOUNTER — Other Ambulatory Visit (HOSPITAL_COMMUNITY): Payer: Self-pay

## 2024-05-21 ENCOUNTER — Other Ambulatory Visit (HOSPITAL_BASED_OUTPATIENT_CLINIC_OR_DEPARTMENT_OTHER): Payer: Self-pay

## 2024-05-21 ENCOUNTER — Other Ambulatory Visit (HOSPITAL_COMMUNITY): Payer: Self-pay

## 2024-05-21 DIAGNOSIS — L102 Pemphigus foliaceous: Secondary | ICD-10-CM | POA: Diagnosis not present

## 2024-05-21 DIAGNOSIS — L109 Pemphigus, unspecified: Secondary | ICD-10-CM | POA: Diagnosis not present

## 2024-05-28 ENCOUNTER — Other Ambulatory Visit (HOSPITAL_COMMUNITY): Payer: Self-pay

## 2024-05-28 MED ORDER — METRONIDAZOLE 0.75 % VA GEL
VAGINAL | 0 refills | Status: DC
  Filled 2024-05-28: qty 70, 5d supply, fill #0

## 2024-05-29 ENCOUNTER — Other Ambulatory Visit (HOSPITAL_COMMUNITY): Payer: Self-pay

## 2024-05-30 ENCOUNTER — Other Ambulatory Visit (HOSPITAL_COMMUNITY): Payer: Self-pay

## 2024-06-14 ENCOUNTER — Other Ambulatory Visit (HOSPITAL_COMMUNITY): Payer: Self-pay

## 2024-06-14 ENCOUNTER — Other Ambulatory Visit: Payer: Self-pay | Admitting: Physician Assistant

## 2024-06-14 ENCOUNTER — Other Ambulatory Visit: Payer: Self-pay

## 2024-06-14 MED ORDER — NIFEDIPINE ER OSMOTIC RELEASE 30 MG PO TB24
30.0000 mg | ORAL_TABLET | Freq: Every day | ORAL | 2 refills | Status: DC
  Filled 2024-06-14: qty 30, 30d supply, fill #0
  Filled 2024-07-16: qty 30, 30d supply, fill #1
  Filled 2024-08-18: qty 30, 30d supply, fill #2

## 2024-06-29 ENCOUNTER — Other Ambulatory Visit (HOSPITAL_COMMUNITY): Payer: Self-pay

## 2024-06-29 MED ORDER — AMOXICILLIN 500 MG PO CAPS
500.0000 mg | ORAL_CAPSULE | Freq: Three times a day (TID) | ORAL | 0 refills | Status: AC
  Filled 2024-06-29: qty 21, 7d supply, fill #0

## 2024-06-29 MED ORDER — HYDROCODONE-ACETAMINOPHEN 10-325 MG PO TABS
1.0000 | ORAL_TABLET | Freq: Four times a day (QID) | ORAL | 0 refills | Status: AC
  Filled 2024-06-29: qty 20, 5d supply, fill #0

## 2024-08-02 ENCOUNTER — Other Ambulatory Visit (HOSPITAL_COMMUNITY): Payer: Self-pay

## 2024-08-02 ENCOUNTER — Other Ambulatory Visit: Payer: Self-pay

## 2024-08-02 ENCOUNTER — Other Ambulatory Visit: Payer: Self-pay | Admitting: Obstetrics

## 2024-08-02 DIAGNOSIS — B379 Candidiasis, unspecified: Secondary | ICD-10-CM

## 2024-08-02 DIAGNOSIS — B9689 Other specified bacterial agents as the cause of diseases classified elsewhere: Secondary | ICD-10-CM

## 2024-08-02 MED ORDER — METRONIDAZOLE 0.75 % VA GEL
VAGINAL | 0 refills | Status: AC
  Filled 2024-08-02: qty 70, 7d supply, fill #0

## 2024-08-02 MED ORDER — FLUCONAZOLE 150 MG PO TABS
ORAL_TABLET | ORAL | 3 refills | Status: AC
  Filled 2024-08-02: qty 1, 1d supply, fill #0
  Filled 2024-08-11 – 2024-08-23 (×2): qty 1, 1d supply, fill #1
  Filled 2024-09-02: qty 1, 1d supply, fill #2
  Filled 2024-09-17: qty 1, 1d supply, fill #3

## 2024-08-06 ENCOUNTER — Other Ambulatory Visit (HOSPITAL_COMMUNITY): Payer: Self-pay

## 2024-08-06 DIAGNOSIS — L102 Pemphigus foliaceous: Secondary | ICD-10-CM | POA: Diagnosis not present

## 2024-08-06 MED ORDER — FLUOCINOLONE ACETONIDE SCALP 0.01 % EX OIL
1.0000 | TOPICAL_OIL | Freq: Every day | CUTANEOUS | 3 refills | Status: AC
  Filled 2024-08-06: qty 118.28, 28d supply, fill #0

## 2024-08-07 ENCOUNTER — Other Ambulatory Visit (HOSPITAL_COMMUNITY): Payer: Self-pay

## 2024-08-21 ENCOUNTER — Other Ambulatory Visit (HOSPITAL_COMMUNITY): Payer: Self-pay

## 2024-08-23 ENCOUNTER — Other Ambulatory Visit (HOSPITAL_COMMUNITY): Payer: Self-pay

## 2024-09-02 ENCOUNTER — Other Ambulatory Visit: Payer: Self-pay | Admitting: Physician Assistant

## 2024-09-03 ENCOUNTER — Other Ambulatory Visit (HOSPITAL_COMMUNITY): Payer: Self-pay

## 2024-09-03 ENCOUNTER — Other Ambulatory Visit: Payer: Self-pay

## 2024-09-03 MED ORDER — NORETHINDRONE 0.35 MG PO TABS
1.0000 | ORAL_TABLET | Freq: Every day | ORAL | 1 refills | Status: AC
  Filled 2024-09-03: qty 84, 84d supply, fill #0

## 2024-09-17 ENCOUNTER — Other Ambulatory Visit (HOSPITAL_COMMUNITY): Payer: Self-pay

## 2024-09-17 ENCOUNTER — Other Ambulatory Visit: Payer: Self-pay

## 2024-09-17 ENCOUNTER — Other Ambulatory Visit: Payer: Self-pay | Admitting: Physician Assistant

## 2024-09-17 MED ORDER — NIFEDIPINE ER OSMOTIC RELEASE 30 MG PO TB24
30.0000 mg | ORAL_TABLET | Freq: Every day | ORAL | 2 refills | Status: AC
  Filled 2024-09-17: qty 30, 30d supply, fill #0
  Filled 2024-10-23: qty 30, 30d supply, fill #1

## 2024-10-23 ENCOUNTER — Other Ambulatory Visit (HOSPITAL_COMMUNITY): Payer: Self-pay

## 2025-02-08 ENCOUNTER — Encounter: Admitting: Physician Assistant
